# Patient Record
Sex: Female | Born: 1950 | Race: Black or African American | Hispanic: No | Smoking: Never smoker
Health system: Southern US, Community
[De-identification: ages and names within clinical notes are randomized; demographics above are authoritative.]

## PROBLEM LIST (undated history)

## (undated) DIAGNOSIS — M1990 Unspecified osteoarthritis, unspecified site: Secondary | ICD-10-CM

## (undated) DIAGNOSIS — Z9889 Other specified postprocedural states: Secondary | ICD-10-CM

## (undated) DIAGNOSIS — I10 Essential (primary) hypertension: Secondary | ICD-10-CM

## (undated) DIAGNOSIS — R112 Nausea with vomiting, unspecified: Secondary | ICD-10-CM

## (undated) DIAGNOSIS — C801 Malignant (primary) neoplasm, unspecified: Secondary | ICD-10-CM

## (undated) DIAGNOSIS — E669 Obesity, unspecified: Secondary | ICD-10-CM

## (undated) DIAGNOSIS — H409 Unspecified glaucoma: Secondary | ICD-10-CM

## (undated) DIAGNOSIS — Z98891 History of uterine scar from previous surgery: Secondary | ICD-10-CM

## (undated) HISTORY — PX: CARPAL TUNNEL RELEASE: SHX101

## (undated) HISTORY — PX: ROTATOR CUFF REPAIR: SHX139

## (undated) HISTORY — DX: Obesity, unspecified: E66.9

## (undated) HISTORY — PX: OTHER SURGICAL HISTORY: SHX169

---

## 1999-04-19 ENCOUNTER — Encounter: Admission: RE | Admit: 1999-04-19 | Discharge: 1999-04-19 | Payer: Self-pay | Admitting: Orthopedic Surgery

## 1999-04-19 ENCOUNTER — Encounter: Payer: Self-pay | Admitting: Orthopedic Surgery

## 2000-02-24 ENCOUNTER — Ambulatory Visit (HOSPITAL_BASED_OUTPATIENT_CLINIC_OR_DEPARTMENT_OTHER): Admission: RE | Admit: 2000-02-24 | Discharge: 2000-02-24 | Payer: Self-pay | Admitting: Orthopedic Surgery

## 2000-11-22 ENCOUNTER — Encounter (HOSPITAL_COMMUNITY): Admission: RE | Admit: 2000-11-22 | Discharge: 2000-12-22 | Payer: Self-pay | Admitting: Orthopedic Surgery

## 2001-01-04 ENCOUNTER — Encounter: Payer: Self-pay | Admitting: Preventative Medicine

## 2001-01-04 ENCOUNTER — Ambulatory Visit (HOSPITAL_COMMUNITY): Admission: RE | Admit: 2001-01-04 | Discharge: 2001-01-04 | Payer: Self-pay | Admitting: Preventative Medicine

## 2001-02-01 ENCOUNTER — Emergency Department (HOSPITAL_COMMUNITY): Admission: EM | Admit: 2001-02-01 | Discharge: 2001-02-01 | Payer: Self-pay | Admitting: Emergency Medicine

## 2001-02-01 ENCOUNTER — Encounter: Payer: Self-pay | Admitting: Emergency Medicine

## 2002-05-29 ENCOUNTER — Encounter (HOSPITAL_COMMUNITY): Admission: RE | Admit: 2002-05-29 | Discharge: 2002-06-28 | Payer: Self-pay | Admitting: Preventative Medicine

## 2003-12-28 ENCOUNTER — Emergency Department (HOSPITAL_COMMUNITY): Admission: EM | Admit: 2003-12-28 | Discharge: 2003-12-28 | Payer: Self-pay | Admitting: *Deleted

## 2004-04-05 ENCOUNTER — Ambulatory Visit (HOSPITAL_COMMUNITY): Admission: RE | Admit: 2004-04-05 | Discharge: 2004-04-05 | Payer: Self-pay | Admitting: Neurology

## 2009-06-17 ENCOUNTER — Encounter: Payer: Self-pay | Admitting: Internal Medicine

## 2009-06-24 ENCOUNTER — Telehealth (INDEPENDENT_AMBULATORY_CARE_PROVIDER_SITE_OTHER): Payer: Self-pay

## 2009-07-07 ENCOUNTER — Telehealth (INDEPENDENT_AMBULATORY_CARE_PROVIDER_SITE_OTHER): Payer: Self-pay

## 2009-07-08 ENCOUNTER — Ambulatory Visit: Payer: Self-pay | Admitting: Internal Medicine

## 2009-07-08 ENCOUNTER — Ambulatory Visit (HOSPITAL_COMMUNITY): Admission: RE | Admit: 2009-07-08 | Discharge: 2009-07-08 | Payer: Self-pay | Admitting: Internal Medicine

## 2009-07-11 ENCOUNTER — Encounter: Payer: Self-pay | Admitting: Internal Medicine

## 2009-07-28 ENCOUNTER — Encounter: Payer: Self-pay | Admitting: Internal Medicine

## 2010-02-01 NOTE — Letter (Signed)
Summary: Internal Other /triage/instructions  Internal Other /triage/instructions   Imported By: Cloria Spring LPN 81/19/1478 29:56:21  _____________________________________________________________________  External Attachment:    Type:   Image     Comment:   External Document

## 2010-02-01 NOTE — Letter (Signed)
Summary: Patient Notice, Colon Biopsy Results  [facility]  [address]   [place], [place] [address]   Phone: [phone]  Fax: [phone]       July 11, 2009   Amy Newman [address] [place], [place]  [address] 14-Jun-1950    Dear [name],  I am pleased to inform you that the biopsies taken during your recent colonoscopy did not show any evidence of cancer upon pathologic examination.  Additional information/recommendations:  No further action is needed at this time.  Please follow-up with your primary care physician for your other healthcare needs.  You should have a repeat colonoscopy examination  in 7 years.  Please call us if you are having persistent problems or have questions about your condition that have not been fully answered at this time.  Sincerely,    R. Roetta Sessions MD, FACP [facility] Ph: [phone]    Fax: [phone]   Appended Document: Patient Notice, Colon Biopsy Results Letter mailed to pt.  Appended Document: Patient Notice, Colon Biopsy Results remider appt made- cdg

## 2010-02-01 NOTE — Progress Notes (Signed)
Summary: tcs prep  Phone Note Call from Patient Call back at Home Phone [phone]   Caller: Patient Summary of Call: *late entry* pt called yesterday- stated she forgot about her procedure  and that she was to be on a clear liquid diet on Monday afternoon. pt had steak, chicken and pork chops around 7pm at a cookout for the 4th. Rescheduled pt for Thursday July 7th at 10:15 because she is off work this week and this is the only time she can do her procedure. pt aware of new date and time and that she will need a ride to and from the hospital. Encouraged pt to follow instructions and clear liquid diet. Went over prep with pt via telephone. Initial call taken by: Hendricks Limes LPN,  July 08, 979 8:59 AM     Appended Document: tcs prep noted

## 2010-02-01 NOTE — Progress Notes (Signed)
Summary: reviewed meds for tcs  Phone Note Call from Patient   Caller: Patient Summary of Call: I had left a message for pt to call, she returned my call and i reviewed her meds for the procedure, and all the meds are the same. Initial call taken by: Cloria Spring LPN,  June 24, 2009 3:58 PM

## 2010-02-01 NOTE — Letter (Signed)
Summary: tcs order  tcs order   Imported By: Hendricks Limes LPN 16/10/9602 54:09:81  _____________________________________________________________________  External Attachment:    Type:   Image     Comment:   External Document

## 2010-12-02 ENCOUNTER — Other Ambulatory Visit (HOSPITAL_COMMUNITY): Payer: Self-pay | Admitting: Family Medicine

## 2010-12-02 DIAGNOSIS — Z139 Encounter for screening, unspecified: Secondary | ICD-10-CM

## 2010-12-29 ENCOUNTER — Ambulatory Visit (HOSPITAL_COMMUNITY)
Admission: RE | Admit: 2010-12-29 | Discharge: 2010-12-29 | Disposition: A | Discharge status: Home / Self Care | Payer: Managed Care, Other (non HMO) | Source: Ambulatory Visit | Attending: Family Medicine | Admitting: Family Medicine

## 2010-12-29 DIAGNOSIS — Z139 Encounter for screening, unspecified: Secondary | ICD-10-CM

## 2010-12-29 DIAGNOSIS — Z1231 Encounter for screening mammogram for malignant neoplasm of breast: Secondary | ICD-10-CM | POA: Insufficient documentation

## 2011-10-25 ENCOUNTER — Other Ambulatory Visit (HOSPITAL_COMMUNITY): Payer: Self-pay | Admitting: Family Medicine

## 2011-10-25 DIAGNOSIS — R109 Unspecified abdominal pain: Secondary | ICD-10-CM

## 2011-10-26 ENCOUNTER — Ambulatory Visit (HOSPITAL_COMMUNITY)
Admission: RE | Admit: 2011-10-26 | Discharge: 2011-10-26 | Disposition: A | Discharge status: Home / Self Care | Payer: Managed Care, Other (non HMO) | Source: Ambulatory Visit | Attending: Family Medicine | Admitting: Family Medicine

## 2011-10-26 DIAGNOSIS — R109 Unspecified abdominal pain: Secondary | ICD-10-CM | POA: Insufficient documentation

## 2016-06-06 ENCOUNTER — Encounter: Payer: Self-pay | Admitting: Internal Medicine

## 2017-01-08 ENCOUNTER — Other Ambulatory Visit (HOSPITAL_COMMUNITY): Payer: Self-pay | Admitting: Family Medicine

## 2017-01-08 DIAGNOSIS — Z0001 Encounter for general adult medical examination with abnormal findings: Secondary | ICD-10-CM

## 2017-01-08 DIAGNOSIS — Z363 Encounter for antenatal screening for malformations: Secondary | ICD-10-CM

## 2017-01-08 DIAGNOSIS — Z1389 Encounter for screening for other disorder: Secondary | ICD-10-CM

## 2017-01-15 ENCOUNTER — Ambulatory Visit (HOSPITAL_COMMUNITY)
Admission: RE | Admit: 2017-01-15 | Discharge: 2017-01-15 | Disposition: A | Discharge status: Home / Self Care | Payer: Managed Care, Other (non HMO) | Source: Ambulatory Visit | Attending: Family Medicine | Admitting: Family Medicine

## 2017-01-15 DIAGNOSIS — Z0001 Encounter for general adult medical examination with abnormal findings: Secondary | ICD-10-CM | POA: Diagnosis not present

## 2017-01-15 DIAGNOSIS — Z1389 Encounter for screening for other disorder: Secondary | ICD-10-CM

## 2017-01-15 DIAGNOSIS — R221 Localized swelling, mass and lump, neck: Secondary | ICD-10-CM | POA: Insufficient documentation

## 2017-04-05 ENCOUNTER — Ambulatory Visit (HOSPITAL_COMMUNITY)
Admission: RE | Admit: 2017-04-05 | Discharge: 2017-04-05 | Disposition: A | Discharge status: Home / Self Care | Payer: Managed Care, Other (non HMO) | Source: Ambulatory Visit | Attending: Family Medicine | Admitting: Family Medicine

## 2017-04-05 ENCOUNTER — Other Ambulatory Visit (HOSPITAL_COMMUNITY): Payer: Self-pay | Admitting: Family Medicine

## 2017-04-05 DIAGNOSIS — M545 Low back pain: Secondary | ICD-10-CM | POA: Diagnosis not present

## 2017-04-05 DIAGNOSIS — M4316 Spondylolisthesis, lumbar region: Secondary | ICD-10-CM | POA: Insufficient documentation

## 2018-04-08 DIAGNOSIS — I10 Essential (primary) hypertension: Secondary | ICD-10-CM | POA: Diagnosis not present

## 2018-04-08 DIAGNOSIS — H81393 Other peripheral vertigo, bilateral: Secondary | ICD-10-CM | POA: Diagnosis not present

## 2018-04-08 DIAGNOSIS — G43C1 Periodic headache syndromes in child or adult, intractable: Secondary | ICD-10-CM | POA: Diagnosis not present

## 2018-04-08 DIAGNOSIS — M130 Polyarthritis, unspecified: Secondary | ICD-10-CM | POA: Diagnosis not present

## 2018-04-25 DIAGNOSIS — G4489 Other headache syndrome: Secondary | ICD-10-CM | POA: Diagnosis not present

## 2018-04-25 DIAGNOSIS — Z1389 Encounter for screening for other disorder: Secondary | ICD-10-CM | POA: Diagnosis not present

## 2018-04-25 DIAGNOSIS — E7849 Other hyperlipidemia: Secondary | ICD-10-CM | POA: Diagnosis not present

## 2018-04-25 DIAGNOSIS — Z681 Body mass index (BMI) 19 or less, adult: Secondary | ICD-10-CM | POA: Diagnosis not present

## 2018-04-25 DIAGNOSIS — I10 Essential (primary) hypertension: Secondary | ICD-10-CM | POA: Diagnosis not present

## 2018-07-11 DIAGNOSIS — H40033 Anatomical narrow angle, bilateral: Secondary | ICD-10-CM | POA: Diagnosis not present

## 2018-07-11 DIAGNOSIS — H401131 Primary open-angle glaucoma, bilateral, mild stage: Secondary | ICD-10-CM | POA: Diagnosis not present

## 2018-11-06 DIAGNOSIS — H401131 Primary open-angle glaucoma, bilateral, mild stage: Secondary | ICD-10-CM | POA: Diagnosis not present

## 2018-11-06 DIAGNOSIS — H40033 Anatomical narrow angle, bilateral: Secondary | ICD-10-CM | POA: Diagnosis not present

## 2018-11-08 DIAGNOSIS — E782 Mixed hyperlipidemia: Secondary | ICD-10-CM | POA: Diagnosis not present

## 2018-11-08 DIAGNOSIS — Z6833 Body mass index (BMI) 33.0-33.9, adult: Secondary | ICD-10-CM | POA: Diagnosis not present

## 2018-11-08 DIAGNOSIS — M170 Bilateral primary osteoarthritis of knee: Secondary | ICD-10-CM | POA: Diagnosis not present

## 2018-11-08 DIAGNOSIS — E6609 Other obesity due to excess calories: Secondary | ICD-10-CM | POA: Diagnosis not present

## 2018-11-08 DIAGNOSIS — E7849 Other hyperlipidemia: Secondary | ICD-10-CM | POA: Diagnosis not present

## 2018-11-08 DIAGNOSIS — I10 Essential (primary) hypertension: Secondary | ICD-10-CM | POA: Diagnosis not present

## 2018-11-08 DIAGNOSIS — M25512 Pain in left shoulder: Secondary | ICD-10-CM | POA: Diagnosis not present

## 2018-11-11 ENCOUNTER — Other Ambulatory Visit (HOSPITAL_COMMUNITY): Payer: Self-pay | Admitting: Family Medicine

## 2018-11-11 DIAGNOSIS — Z1231 Encounter for screening mammogram for malignant neoplasm of breast: Secondary | ICD-10-CM

## 2018-11-12 DIAGNOSIS — Z1211 Encounter for screening for malignant neoplasm of colon: Secondary | ICD-10-CM | POA: Diagnosis not present

## 2018-11-26 DIAGNOSIS — R69 Illness, unspecified: Secondary | ICD-10-CM | POA: Diagnosis not present

## 2018-12-17 DIAGNOSIS — R69 Illness, unspecified: Secondary | ICD-10-CM | POA: Diagnosis not present

## 2019-01-08 DIAGNOSIS — H81393 Other peripheral vertigo, bilateral: Secondary | ICD-10-CM | POA: Diagnosis not present

## 2019-01-08 DIAGNOSIS — I10 Essential (primary) hypertension: Secondary | ICD-10-CM | POA: Diagnosis not present

## 2019-01-08 DIAGNOSIS — M130 Polyarthritis, unspecified: Secondary | ICD-10-CM | POA: Diagnosis not present

## 2019-01-08 DIAGNOSIS — G43C1 Periodic headache syndromes in child or adult, intractable: Secondary | ICD-10-CM | POA: Diagnosis not present

## 2019-01-16 ENCOUNTER — Other Ambulatory Visit (HOSPITAL_COMMUNITY): Payer: Self-pay | Admitting: Family Medicine

## 2019-01-16 DIAGNOSIS — G4489 Other headache syndrome: Secondary | ICD-10-CM | POA: Diagnosis not present

## 2019-01-16 DIAGNOSIS — E2839 Other primary ovarian failure: Secondary | ICD-10-CM

## 2019-01-16 DIAGNOSIS — H8110 Benign paroxysmal vertigo, unspecified ear: Secondary | ICD-10-CM | POA: Diagnosis not present

## 2019-01-16 DIAGNOSIS — R700 Elevated erythrocyte sedimentation rate: Secondary | ICD-10-CM | POA: Diagnosis not present

## 2019-01-16 DIAGNOSIS — Z6833 Body mass index (BMI) 33.0-33.9, adult: Secondary | ICD-10-CM | POA: Diagnosis not present

## 2019-01-16 DIAGNOSIS — R42 Dizziness and giddiness: Secondary | ICD-10-CM | POA: Diagnosis not present

## 2019-01-16 DIAGNOSIS — R9431 Abnormal electrocardiogram [ECG] [EKG]: Secondary | ICD-10-CM | POA: Diagnosis not present

## 2019-01-16 DIAGNOSIS — E6609 Other obesity due to excess calories: Secondary | ICD-10-CM | POA: Diagnosis not present

## 2019-01-16 DIAGNOSIS — Z1389 Encounter for screening for other disorder: Secondary | ICD-10-CM | POA: Diagnosis not present

## 2019-01-29 DIAGNOSIS — H903 Sensorineural hearing loss, bilateral: Secondary | ICD-10-CM | POA: Diagnosis not present

## 2019-01-29 DIAGNOSIS — R42 Dizziness and giddiness: Secondary | ICD-10-CM | POA: Diagnosis not present

## 2019-02-04 DIAGNOSIS — R42 Dizziness and giddiness: Secondary | ICD-10-CM | POA: Diagnosis not present

## 2019-02-13 DIAGNOSIS — Z681 Body mass index (BMI) 19 or less, adult: Secondary | ICD-10-CM | POA: Diagnosis not present

## 2019-02-13 DIAGNOSIS — E7849 Other hyperlipidemia: Secondary | ICD-10-CM | POA: Diagnosis not present

## 2019-02-13 DIAGNOSIS — Z1389 Encounter for screening for other disorder: Secondary | ICD-10-CM | POA: Diagnosis not present

## 2019-02-13 DIAGNOSIS — Z0001 Encounter for general adult medical examination with abnormal findings: Secondary | ICD-10-CM | POA: Diagnosis not present

## 2019-02-13 DIAGNOSIS — M170 Bilateral primary osteoarthritis of knee: Secondary | ICD-10-CM | POA: Diagnosis not present

## 2019-02-13 DIAGNOSIS — I10 Essential (primary) hypertension: Secondary | ICD-10-CM | POA: Diagnosis not present

## 2019-02-17 ENCOUNTER — Ambulatory Visit (HOSPITAL_COMMUNITY): Payer: Managed Care, Other (non HMO)

## 2019-02-17 ENCOUNTER — Other Ambulatory Visit (HOSPITAL_COMMUNITY): Payer: Managed Care, Other (non HMO)

## 2019-02-18 DIAGNOSIS — R69 Illness, unspecified: Secondary | ICD-10-CM | POA: Diagnosis not present

## 2019-02-19 DIAGNOSIS — H832X2 Labyrinthine dysfunction, left ear: Secondary | ICD-10-CM | POA: Diagnosis not present

## 2019-02-19 DIAGNOSIS — R42 Dizziness and giddiness: Secondary | ICD-10-CM | POA: Diagnosis not present

## 2019-02-24 DIAGNOSIS — H832X2 Labyrinthine dysfunction, left ear: Secondary | ICD-10-CM | POA: Diagnosis not present

## 2019-02-24 DIAGNOSIS — R42 Dizziness and giddiness: Secondary | ICD-10-CM | POA: Diagnosis not present

## 2019-02-26 ENCOUNTER — Ambulatory Visit (HOSPITAL_COMMUNITY)
Admission: RE | Admit: 2019-02-26 | Discharge: 2019-02-26 | Disposition: A | Discharge status: Home / Self Care | Payer: Medicare HMO | Source: Ambulatory Visit | Attending: Family Medicine | Admitting: Family Medicine

## 2019-02-26 ENCOUNTER — Other Ambulatory Visit: Payer: Self-pay

## 2019-02-26 DIAGNOSIS — E2839 Other primary ovarian failure: Secondary | ICD-10-CM | POA: Diagnosis not present

## 2019-02-26 DIAGNOSIS — Z1231 Encounter for screening mammogram for malignant neoplasm of breast: Secondary | ICD-10-CM

## 2019-02-26 DIAGNOSIS — Z13820 Encounter for screening for osteoporosis: Secondary | ICD-10-CM | POA: Diagnosis not present

## 2019-02-26 DIAGNOSIS — Z780 Asymptomatic menopausal state: Secondary | ICD-10-CM | POA: Diagnosis not present

## 2019-02-27 DIAGNOSIS — H832X2 Labyrinthine dysfunction, left ear: Secondary | ICD-10-CM | POA: Diagnosis not present

## 2019-02-27 DIAGNOSIS — R42 Dizziness and giddiness: Secondary | ICD-10-CM | POA: Diagnosis not present

## 2019-03-27 DIAGNOSIS — R197 Diarrhea, unspecified: Secondary | ICD-10-CM | POA: Diagnosis not present

## 2019-03-27 DIAGNOSIS — R52 Pain, unspecified: Secondary | ICD-10-CM | POA: Diagnosis not present

## 2019-05-06 DIAGNOSIS — H40033 Anatomical narrow angle, bilateral: Secondary | ICD-10-CM | POA: Diagnosis not present

## 2019-05-06 DIAGNOSIS — H2513 Age-related nuclear cataract, bilateral: Secondary | ICD-10-CM | POA: Diagnosis not present

## 2019-05-06 DIAGNOSIS — H401131 Primary open-angle glaucoma, bilateral, mild stage: Secondary | ICD-10-CM | POA: Diagnosis not present

## 2019-05-06 DIAGNOSIS — H25013 Cortical age-related cataract, bilateral: Secondary | ICD-10-CM | POA: Diagnosis not present

## 2019-05-27 DIAGNOSIS — M25562 Pain in left knee: Secondary | ICD-10-CM | POA: Diagnosis not present

## 2019-05-27 DIAGNOSIS — M2550 Pain in unspecified joint: Secondary | ICD-10-CM | POA: Diagnosis not present

## 2019-05-27 DIAGNOSIS — E669 Obesity, unspecified: Secondary | ICD-10-CM | POA: Diagnosis not present

## 2019-05-27 DIAGNOSIS — Z6834 Body mass index (BMI) 34.0-34.9, adult: Secondary | ICD-10-CM | POA: Diagnosis not present

## 2019-05-27 DIAGNOSIS — R7989 Other specified abnormal findings of blood chemistry: Secondary | ICD-10-CM | POA: Diagnosis not present

## 2019-05-27 DIAGNOSIS — M25561 Pain in right knee: Secondary | ICD-10-CM | POA: Diagnosis not present

## 2019-06-10 DIAGNOSIS — R69 Illness, unspecified: Secondary | ICD-10-CM | POA: Diagnosis not present

## 2019-08-27 DIAGNOSIS — I10 Essential (primary) hypertension: Secondary | ICD-10-CM | POA: Diagnosis not present

## 2019-08-27 DIAGNOSIS — Z809 Family history of malignant neoplasm, unspecified: Secondary | ICD-10-CM | POA: Diagnosis not present

## 2019-08-27 DIAGNOSIS — H409 Unspecified glaucoma: Secondary | ICD-10-CM | POA: Diagnosis not present

## 2019-08-27 DIAGNOSIS — K219 Gastro-esophageal reflux disease without esophagitis: Secondary | ICD-10-CM | POA: Diagnosis not present

## 2019-08-27 DIAGNOSIS — E785 Hyperlipidemia, unspecified: Secondary | ICD-10-CM | POA: Diagnosis not present

## 2019-08-27 DIAGNOSIS — Z833 Family history of diabetes mellitus: Secondary | ICD-10-CM | POA: Diagnosis not present

## 2019-08-27 DIAGNOSIS — G8929 Other chronic pain: Secondary | ICD-10-CM | POA: Diagnosis not present

## 2019-08-28 DIAGNOSIS — Z6834 Body mass index (BMI) 34.0-34.9, adult: Secondary | ICD-10-CM | POA: Diagnosis not present

## 2019-08-28 DIAGNOSIS — E6609 Other obesity due to excess calories: Secondary | ICD-10-CM | POA: Diagnosis not present

## 2019-08-28 DIAGNOSIS — M79671 Pain in right foot: Secondary | ICD-10-CM | POA: Diagnosis not present

## 2019-08-28 DIAGNOSIS — I10 Essential (primary) hypertension: Secondary | ICD-10-CM | POA: Diagnosis not present

## 2019-09-02 DIAGNOSIS — K219 Gastro-esophageal reflux disease without esophagitis: Secondary | ICD-10-CM | POA: Diagnosis not present

## 2019-09-02 DIAGNOSIS — I10 Essential (primary) hypertension: Secondary | ICD-10-CM | POA: Diagnosis not present

## 2019-09-02 DIAGNOSIS — M792 Neuralgia and neuritis, unspecified: Secondary | ICD-10-CM | POA: Diagnosis not present

## 2019-09-02 DIAGNOSIS — E7849 Other hyperlipidemia: Secondary | ICD-10-CM | POA: Diagnosis not present

## 2019-09-02 DIAGNOSIS — M79671 Pain in right foot: Secondary | ICD-10-CM | POA: Diagnosis not present

## 2019-09-16 ENCOUNTER — Other Ambulatory Visit: Payer: Self-pay

## 2019-09-16 ENCOUNTER — Ambulatory Visit
Admission: EM | Admit: 2019-09-16 | Discharge: 2019-09-16 | Disposition: A | Discharge status: Home / Self Care | Payer: Medicare HMO

## 2019-09-16 DIAGNOSIS — G629 Polyneuropathy, unspecified: Secondary | ICD-10-CM

## 2019-09-16 DIAGNOSIS — G5712 Meralgia paresthetica, left lower limb: Secondary | ICD-10-CM

## 2019-09-16 HISTORY — DX: Essential (primary) hypertension: I10

## 2019-09-16 HISTORY — DX: History of uterine scar from previous surgery: Z98.891

## 2019-09-16 MED ORDER — GABAPENTIN 300 MG PO CAPS: 300.0000 mg | ORAL_CAPSULE | Freq: Two times a day (BID) | ORAL | 0 refills | Status: DC

## 2019-09-16 MED ORDER — LIDOCAINE 5 % EX PTCH: 1.0000 | MEDICATED_PATCH | CUTANEOUS | 0 refills | Status: DC

## 2019-09-16 NOTE — ED Triage Notes (Signed)
Pt presents with left leg pain and some tingling in left thigh . Pt was taking prednisone for right toe but no relief in leg

## 2019-09-16 NOTE — Discharge Instructions (Addendum)
Please return to urgent care if you observe a rash or symptoms worsen.

## 2019-09-24 ENCOUNTER — Other Ambulatory Visit: Payer: Self-pay

## 2019-09-24 ENCOUNTER — Encounter: Payer: Self-pay | Admitting: Emergency Medicine

## 2019-09-24 ENCOUNTER — Ambulatory Visit
Admission: EM | Admit: 2019-09-24 | Discharge: 2019-09-24 | Disposition: A | Discharge status: Home / Self Care | Payer: Medicare HMO | Attending: Emergency Medicine | Admitting: Emergency Medicine

## 2019-09-24 DIAGNOSIS — B0229 Other postherpetic nervous system involvement: Secondary | ICD-10-CM

## 2019-09-24 MED ORDER — VALACYCLOVIR HCL 1 G PO TABS: 1000.0000 mg | ORAL_TABLET | Freq: Three times a day (TID) | ORAL | 0 refills | Status: AC

## 2019-09-24 NOTE — ED Provider Notes (Signed)
[place]   [id number] 09/24/19 Arrival Time: 3  CC: Shingles follow up  SUBJECTIVE:  Amy Newman is a 69 y.o. female who presents with a numbness and rash to outside of LT leg x 1 week.  Was seen here on 9/14 for shingles.  Treated with gabapentin without relief.  Describes as numb and throbbing.  Rash noticeable after taking a shower.  Reports similar symptoms in the past that improved with antiviral.   Denies fever, chills, nausea, vomiting, erythema, swelling, discharge.  ROS: As per HPI.  All other pertinent ROS negative.     Past Medical History:  Diagnosis Date  . H/O: cesarean section   . Hypertension    Past Surgical History:  Procedure Laterality Date  . CARPAL TUNNEL RELEASE Right    Allergies  Allergen Reactions  . Penicillins Rash   No current facility-administered medications on file prior to encounter.   Current Outpatient Medications on File Prior to Encounter  Medication Sig Dispense Refill  . atenolol (TENORMIN) 50 MG tablet Take 50 mg by mouth daily.    Marland Kitchen atorvastatin (LIPITOR) 20 MG tablet Take 20 mg by mouth daily.    Marland Kitchen gabapentin (NEURONTIN) 300 MG capsule Take 1 capsule (300 mg total) by mouth 2 (two) times daily. 60 capsule 0  . lidocaine (LIDODERM) 5 % Place 1 patch onto the skin daily. Remove & Discard patch within 12 hours or as directed by MD 30 patch 0  . methylPREDNISolone (MEDROL DOSEPAK) 4 MG TBPK tablet Take by mouth as directed.    Marland Kitchen omeprazole (PRILOSEC) 20 MG capsule Take 20 mg by mouth daily.     Social History   Socioeconomic History  . Marital status: Divorced    Spouse name: Not on file  . Number of children: Not on file  . Years of education: Not on file  . Highest education level: Not on file  Occupational History  . Not on file  Tobacco Use  . Smoking status: Never Smoker  . Smokeless tobacco: Never Used  Substance and Sexual Activity  . Alcohol use: Not on file  . Drug use: Not on file  . Sexual  activity: Not on file  Other Topics Concern  . Not on file  Social History Narrative  . Not on file   Social Determinants of Health   Financial Resource Strain:   . Difficulty of Paying Living Expenses: Not on file  Food Insecurity:   . Worried About Charity fundraiser in the Last Year: Not on file  . Ran Out of Food in the Last Year: Not on file  Transportation Needs:   . Lack of Transportation (Medical): Not on file  . Lack of Transportation (Non-Medical): Not on file  Physical Activity:   . Days of Exercise per Week: Not on file  . Minutes of Exercise per Session: Not on file  Stress:   . Feeling of Stress : Not on file  Social Connections:   . Frequency of Communication with Friends and Family: Not on file  . Frequency of Social Gatherings with Friends and Family: Not on file  . Attends Religious Services: Not on file  . Active Member of Clubs or Organizations: Not on file  . Attends Archivist Meetings: Not on file  . Marital Status: Not on file  Intimate Partner Violence:   . Fear of Current or Ex-Partner: Not on file  . Emotionally Abused: Not on file  . Physically Abused: Not  on file  . Sexually Abused: Not on file   Family History  Problem Relation Age of Onset  . Stroke Mother     OBJECTIVE: Vitals:   09/24/19 1526 09/24/19 1527  BP: (!) 172/84   Pulse: 75   Resp: 19   Temp: 98.1 F (36.7 C)   TempSrc: Oral   SpO2: 96%   Weight:  220 lb (99.8 kg)  Height:  5\' 7"  (1.702 m)    General appearance: alert; no distress Head: NCAT Lungs: normal respiratory effort Extremities: no edema Skin: warm and dry Psychological: alert and cooperative; normal mood and affect  ASSESSMENT & PLAN:  1. Post herpetic neuralgia     Meds ordered this encounter  Medications  . valACYclovir (VALTREX) 1000 MG tablet    Sig: Take 1 tablet (1,000 mg total) by mouth 3 (three) times daily for 10 days.    Dispense:  30 tablet    Refill:  0    Order Specific  Question:   Supervising Provider    Answer:   Raylene Everts [[id number]]   Rest and use ice/heat as needed for symptomatic relief Prescribed valacyclovir 1000mg  3x/day for 10 days Continue with gabapentin as prescribed Use OTC medications such as ibuprofen/ tylenol.   Follow up with PCP next week Return here or go to ER if you have any new or worsening symptoms (such as eye involvement, severe pain, or signs of secondary infection such as fever, chills, nausea, vomiting, discharge, redness or warmth over site of rash)  Reviewed expectations re: course of current medical issues. Questions answered. Outlined signs and symptoms indicating need for more acute intervention. Patient verbalized understanding. After Visit Summary given.   Lestine Box, PA-C 09/24/19 1540

## 2019-09-24 NOTE — Discharge Instructions (Signed)
Rest and use ice/heat as needed for symptomatic relief Prescribed valacyclovir 1000mg  3x/day for 10 days Continue with gabapentin as prescribed Use OTC medications such as ibuprofen/ tylenol.   Follow up with PCP next week Return here or go to ER if you have any new or worsening symptoms (such as eye involvement, severe pain, or signs of secondary infection such as fever, chills, nausea, vomiting, discharge, redness or warmth over site of rash)

## 2019-09-24 NOTE — ED Triage Notes (Signed)
LT leg pain from shingles, states the medicine she was given is not helping at all.

## 2019-09-30 DIAGNOSIS — M79671 Pain in right foot: Secondary | ICD-10-CM | POA: Diagnosis not present

## 2019-09-30 DIAGNOSIS — M792 Neuralgia and neuritis, unspecified: Secondary | ICD-10-CM | POA: Diagnosis not present

## 2019-10-16 DIAGNOSIS — Z6834 Body mass index (BMI) 34.0-34.9, adult: Secondary | ICD-10-CM | POA: Diagnosis not present

## 2019-10-16 DIAGNOSIS — E7849 Other hyperlipidemia: Secondary | ICD-10-CM | POA: Diagnosis not present

## 2019-10-16 DIAGNOSIS — B029 Zoster without complications: Secondary | ICD-10-CM | POA: Diagnosis not present

## 2019-10-16 DIAGNOSIS — E782 Mixed hyperlipidemia: Secondary | ICD-10-CM | POA: Diagnosis not present

## 2019-10-16 DIAGNOSIS — E6609 Other obesity due to excess calories: Secondary | ICD-10-CM | POA: Diagnosis not present

## 2019-10-21 DIAGNOSIS — M79671 Pain in right foot: Secondary | ICD-10-CM | POA: Diagnosis not present

## 2019-10-21 DIAGNOSIS — M792 Neuralgia and neuritis, unspecified: Secondary | ICD-10-CM | POA: Diagnosis not present

## 2019-10-28 DIAGNOSIS — M79671 Pain in right foot: Secondary | ICD-10-CM | POA: Diagnosis not present

## 2019-10-28 DIAGNOSIS — M792 Neuralgia and neuritis, unspecified: Secondary | ICD-10-CM | POA: Diagnosis not present

## 2019-11-01 DIAGNOSIS — E7849 Other hyperlipidemia: Secondary | ICD-10-CM | POA: Diagnosis not present

## 2019-11-01 DIAGNOSIS — K219 Gastro-esophageal reflux disease without esophagitis: Secondary | ICD-10-CM | POA: Diagnosis not present

## 2019-11-01 DIAGNOSIS — I10 Essential (primary) hypertension: Secondary | ICD-10-CM | POA: Diagnosis not present

## 2019-11-06 DIAGNOSIS — H40033 Anatomical narrow angle, bilateral: Secondary | ICD-10-CM | POA: Diagnosis not present

## 2019-11-06 DIAGNOSIS — H401131 Primary open-angle glaucoma, bilateral, mild stage: Secondary | ICD-10-CM | POA: Diagnosis not present

## 2019-11-12 ENCOUNTER — Encounter: Payer: Self-pay | Admitting: Neurology

## 2019-11-12 ENCOUNTER — Ambulatory Visit: Payer: Medicare HMO | Admitting: Neurology

## 2019-11-12 ENCOUNTER — Other Ambulatory Visit: Payer: Self-pay

## 2019-11-12 DIAGNOSIS — M79671 Pain in right foot: Secondary | ICD-10-CM | POA: Diagnosis not present

## 2019-11-12 MED ORDER — GABAPENTIN 400 MG PO CAPS: 400.0000 mg | ORAL_CAPSULE | Freq: Three times a day (TID) | ORAL | 3 refills | Status: DC

## 2019-11-12 MED ORDER — CARBAMAZEPINE 200 MG PO TABS: ORAL_TABLET | ORAL | 3 refills | Status: DC

## 2019-11-12 NOTE — Patient Instructions (Signed)
We will start carbamazepine for the foot pain.  Tegretol (carbamazepine) may result in dizziness, gait instability, cognitive slowing, or drowsiness. Sometimes, and allergic rash may occur, or a photosensitive rash may occur. If any significant side effects are noted, please contact our office.

## 2019-11-12 NOTE — Progress Notes (Signed)
Reason for visit: Right foot pain  Referring physician: Dr. Nani Ravens is a 69 y.o. female  History of present illness:  Ms. Moya is a 69 year old right-handed black female with a history of right foot pain that began about 5 weeks prior to this evaluation. The patient noted the spontaneous onset of the problem. She has had pain in the medial aspect of the side of the foot from the base of the big toe to about halfway back on the foot. The patient describes sharp shooting almost electric pains that come and go, she has very little pain between the brief jabs of pain. She has gotten some injections through her podiatrist which have not helped the pain that much but she is now somewhat sore on the top of the foot. The patient notes that at times light touch or wearing a shoe will bring on the pain, but she does have pain throughout the night which keeps her awake. The patient claims that she put on a patch of some sort that did not help, possibly a lidocaine patch. She has been on gabapentin, currently taking 400 mg 3 times daily without benefit. She denies any weakness in the leg, she denies back pain or pain down the leg. She has not any changes in balance or any troubles controlling bowels or the bladder. She did have an episode of shingles affecting the left thigh and the midportion of September 2021, she claims that this issue has resolved. She denies any shingles rash involving the right leg. She is sent to this office for further evaluation.  Past Medical History:  Diagnosis Date  . H/O: cesarean section   . Hypertension   . Obese     Past Surgical History:  Procedure Laterality Date  . athroscopic knee surgery bilaterall Bilateral   . CARPAL TUNNEL RELEASE Right   . ROTATOR CUFF REPAIR Right     Family History  Problem Relation Age of Onset  . Stroke Mother   . Diabetes Mother   . Cancer Father   . Cancer Sister        breast  . Diabetes Brother   .  Peripheral Artery Disease Brother     Social history:  reports that she has never smoked. She has never used smokeless tobacco. She reports that she does not drink alcohol and does not use drugs.  Medications:  Prior to Admission medications   Medication Sig Start Date End Date Taking? Authorizing Provider  atenolol (TENORMIN) 50 MG tablet Take 50 mg by mouth daily. 08/05/19   [provider]  atorvastatin (LIPITOR) 20 MG tablet Take 20 mg by mouth daily. 08/02/19   [provider]  gabapentin (NEURONTIN) 300 MG capsule Take 1 capsule (300 mg total) by mouth 2 (two) times daily. 09/16/19 10/16/19  LampteyMyrene Galas, MD  lidocaine (LIDODERM) 5 % Place 1 patch onto the skin daily. Remove & Discard patch within 12 hours or as directed by MD 09/16/19   Lamptey, Myrene Galas, MD  methylPREDNISolone (MEDROL DOSEPAK) 4 MG TBPK tablet Take by mouth as directed. 09/13/19   [provider]  omeprazole (PRILOSEC) 20 MG capsule Take 20 mg by mouth daily. 07/12/19   [provider]      Allergies  Allergen Reactions  . Penicillins Rash    ROS:  Out of a complete 14 system review of symptoms, the patient complains only of the following symptoms, and all other reviewed systems are negative.  Right foot pain Knee pain, arthritis pain  Blood pressure 138/74, pulse 76, height 5\' 8"  (1.727 m), weight 229 lb (103.9 kg).  Physical Exam  General: The patient is alert and cooperative at the time of the examination. The patient is moderately obese.  Eyes: Pupils are equal, round, and reactive to light. Discs are flat bilaterally.  Neck: The neck is supple, no carotid bruits are noted.  Respiratory: The respiratory examination is clear.  Cardiovascular: The cardiovascular examination reveals a regular rate and rhythm, no obvious murmurs or rubs are noted.  Skin: Extremities are without significant edema.  Neurologic Exam  Mental status: The patient is alert and oriented  x 3 at the time of the examination. The patient has apparent normal recent and remote memory, with an apparently normal attention span and concentration ability.  Cranial nerves: Facial symmetry is present. There is good sensation of the face to pinprick and soft touch bilaterally. The strength of the facial muscles and the muscles to head turning and shoulder shrug are normal bilaterally. Speech is well enunciated, no aphasia or dysarthria is noted. Extraocular movements are full. Visual fields are full. The tongue is midline, and the patient has symmetric elevation of the soft palate. No obvious hearing deficits are noted.  Motor: The motor testing reveals 5 over 5 strength of all 4 extremities. Good symmetric motor tone is noted throughout.  Sensory: Sensory testing is intact to pinprick, soft touch, vibration sensation, and position sense on all 4 extremities, with exception there is some decreased pinprick sensation on the dorsum of the right foot as compared to the left. No evidence of extinction is noted.  Coordination: Cerebellar testing reveals good finger-nose-finger and heel-to-shin bilaterally.  Gait and station: Gait is notable for some bowing of the legs bilaterally, gait is somewhat wide-based. Patient has difficulty with tandem gait, somewhat unsteady. Romberg is negative. The patient is not able to walk on heels or the toes bilaterally.  Reflexes: Deep tendon reflexes are symmetric, but somewhat depressed bilaterally. Toes are downgoing bilaterally.   Assessment/Plan:  1. Right foot pain  The patient has a neuralgia quality to the pain of the right foot. She has some decreased sensation to the dorsum of the foot, she could potentially have some involvement distally of the peroneal nerve that has resulted in some neuralgia type pain. The patient be set up for nerve conduction studies of the right leg and EMG on the right leg. She will placed on carbamazepine and continue the  gabapentin for now. She will follow-up for the EMG evaluation and follow-up in the office in about 4 months.  Jill Alexanders MD 11/12/2019 10:27 AM  [facility] [address] [place],  [address]  Phone [phone] Fax [phone]

## 2019-11-26 ENCOUNTER — Ambulatory Visit (INDEPENDENT_AMBULATORY_CARE_PROVIDER_SITE_OTHER): Payer: Medicare HMO | Admitting: Neurology

## 2019-11-26 ENCOUNTER — Ambulatory Visit: Payer: Medicare HMO | Admitting: Neurology

## 2019-11-26 ENCOUNTER — Encounter: Payer: Self-pay | Admitting: Neurology

## 2019-11-26 ENCOUNTER — Other Ambulatory Visit: Payer: Self-pay

## 2019-11-26 DIAGNOSIS — M79671 Pain in right foot: Secondary | ICD-10-CM

## 2019-11-26 DIAGNOSIS — M5417 Radiculopathy, lumbosacral region: Secondary | ICD-10-CM

## 2019-11-26 NOTE — Procedures (Signed)
     HISTORY:  Amy Newman is a 69 year old patient with a history of onset of right foot and big toe discomfort and pain. The pain can be quite severe at times. She denies sciatica type pain down the leg but she is being evaluated for possible neuropathy or a radiculopathy.  NERVE CONDUCTION STUDIES:  Nerve conduction studies were performed on right lower extremity. The distal motor latencies and motor amplitudes for the peroneal and posterior tibial nerves were within normal limits. The nerve conduction velocities for these nerves were also normal. The sensory latencies for the peroneal and sural nerves were within normal limits. The F wave latency for the posterior tibial nerve was within normal limits.   EMG STUDIES:  EMG study was performed on the right lower extremity:  The tibialis anterior muscle reveals 2 to 4K motor units with full recruitment. No fibrillations or positive waves were seen. The peroneus tertius muscle reveals 2 to 4K motor units with full recruitment. No fibrillations or positive waves were seen. The medial gastrocnemius muscle reveals 1 to 3K motor units with full recruitment. 1+ positive waves were seen. The vastus lateralis muscle reveals 2 to 4K motor units with full recruitment. No fibrillations or positive waves were seen. The iliopsoas muscle reveals 2 to 4K motor units with full recruitment. No fibrillations or positive waves were seen. The biceps femoris muscle (long head) reveals 2 to 4K motor units with full recruitment. No fibrillations or positive waves were seen. The lumbosacral paraspinal muscles were tested at 3 levels, and revealed no abnormalities of insertional activity at the upper levels tested. 1+ positive waves were seen at the middle level, 2+ positive waves in the lower level. There was good relaxation.   IMPRESSION:  Nerve conduction studies done on the right lower extremity were unremarkable, no evidence of a neuropathy was seen.  EMG of the right lower extremity shows findings consistent with an acute S1 radiculopathy.  Jill Alexanders MD 11/26/2019 3:35 PM  [facility] [address] [place], [place] [address]  Phone [phone] Fax [phone]

## 2019-11-26 NOTE — Progress Notes (Addendum)
The patient comes into the office today for EMG nerve conduction study. Nerve conduction studies on the right leg were unremarkable, EMG reveals evidence of what looks like an acute S1 radiculopathy.  The patient will be set up for MRI of the lumbar spine.     [place]    Nerve / Sites Muscle Latency Ref. Amplitude Ref. Rel Amp Segments Distance Velocity Ref. Area    ms ms mV mV %  cm m/s m/s mVms  R Peroneal - EDB     Ankle EDB 4.9 ?6.5 4.5 ?2.0 100 Ankle - EDB 9   13.7     Fib head EDB 10.6  2.9  64.5 Fib head - Ankle 27 48 ?44 11.1     Pop fossa EDB 12.6  4.2  145 Pop fossa - Fib head 10 51 ?44 12.6         Pop fossa - Ankle      R Tibial - AH     Ankle AH 5.0 ?5.8 6.8 ?4.0 100 Ankle - AH 9   12.9     Pop fossa AH 14.1  2.1  31.6 Pop fossa - Ankle 38 41 ?41 6.5         SNC    Nerve / Sites Rec. Site Peak Lat Ref.  Amp Ref. Segments Distance    ms ms V V  cm  R Sural - Ankle (Calf)     Calf Ankle 3.6 ?4.4 17 ?6 Calf - Ankle 14  R Superficial peroneal - Ankle     Lat leg Ankle 3.8 ?4.4 9 ?6 Lat leg - Ankle 14         F  Wave    Nerve F Lat Ref.   ms ms  R Tibial - AH 53.6 ?56.0

## 2019-11-26 NOTE — Progress Notes (Signed)
Please refer to EMG and nerve conduction procedure note.  

## 2019-12-01 ENCOUNTER — Other Ambulatory Visit: Payer: Self-pay | Admitting: Neurology

## 2019-12-01 ENCOUNTER — Telehealth: Payer: Self-pay | Admitting: Neurology

## 2019-12-01 NOTE — Telephone Encounter (Signed)
[employer] medicare order sent to [facility]. They will obtain the auth and reach out to the patient to schedule.  °

## 2019-12-02 DIAGNOSIS — E782 Mixed hyperlipidemia: Secondary | ICD-10-CM | POA: Diagnosis not present

## 2019-12-02 DIAGNOSIS — E7849 Other hyperlipidemia: Secondary | ICD-10-CM | POA: Diagnosis not present

## 2019-12-02 DIAGNOSIS — I10 Essential (primary) hypertension: Secondary | ICD-10-CM | POA: Diagnosis not present

## 2019-12-02 DIAGNOSIS — K219 Gastro-esophageal reflux disease without esophagitis: Secondary | ICD-10-CM | POA: Diagnosis not present

## 2019-12-07 ENCOUNTER — Other Ambulatory Visit: Payer: Self-pay

## 2019-12-07 ENCOUNTER — Ambulatory Visit
Admission: RE | Admit: 2019-12-07 | Discharge: 2019-12-07 | Disposition: A | Discharge status: Home / Self Care | Payer: Medicare HMO | Source: Ambulatory Visit | Attending: Neurology | Admitting: Neurology

## 2019-12-07 DIAGNOSIS — M5417 Radiculopathy, lumbosacral region: Secondary | ICD-10-CM

## 2019-12-08 ENCOUNTER — Telehealth: Payer: Self-pay | Admitting: Neurology

## 2019-12-08 ENCOUNTER — Other Ambulatory Visit: Payer: Self-pay | Admitting: Neurology

## 2019-12-08 DIAGNOSIS — M48061 Spinal stenosis, lumbar region without neurogenic claudication: Secondary | ICD-10-CM

## 2019-12-08 NOTE — Telephone Encounter (Signed)
  I called the patient.  The patient has significant spinal stenosis at the L1-2, L2-3, and L3-4 levels, the discomfort she is having in the foot is likely related to the back.  I will try an epidural steroid injection, if this is not helpful in the medications she is taking are not helpful, we may consider a surgical referral.  MRI lumbar 12/07/19:  IMPRESSION: This MRI of the lumbar spine without contrast shows the following: 1.   There is severe spinal stenosis at L1-L2, L2-L3 and L3-L4 and moderate spinal stenosis at L4-L5 and L5-S1 due to various combinations of disc protrusion/herniation, facet hypertrophy and ligamenta flava hypertrophy.  Due to the severity of the spinal stenosis, there is probable nerve root compression at multiple levels. 2.    There is a large renal cyst at the upper pole of the left kidney.

## 2019-12-15 ENCOUNTER — Telehealth: Payer: Self-pay | Admitting: Emergency Medicine

## 2019-12-15 NOTE — Telephone Encounter (Signed)
Received notification of preapproval for [facility] from [place] [facility].  Authorization valid from 12/11/19 through 06/08/2020.  Reference # [id number], Authorization # [id number]

## 2019-12-18 ENCOUNTER — Other Ambulatory Visit: Payer: Self-pay

## 2019-12-18 ENCOUNTER — Ambulatory Visit
Admission: RE | Admit: 2019-12-18 | Discharge: 2019-12-18 | Disposition: A | Discharge status: Home / Self Care | Payer: Medicare HMO | Source: Ambulatory Visit | Attending: Neurology | Admitting: Neurology

## 2019-12-18 DIAGNOSIS — M47817 Spondylosis without myelopathy or radiculopathy, lumbosacral region: Secondary | ICD-10-CM | POA: Diagnosis not present

## 2019-12-18 DIAGNOSIS — M48061 Spinal stenosis, lumbar region without neurogenic claudication: Secondary | ICD-10-CM

## 2019-12-18 MED ORDER — METHYLPREDNISOLONE ACETATE 40 MG/ML INJ SUSP (RADIOLOG
120.0000 mg | Freq: Once | INTRAMUSCULAR | Status: AC
  Administered 2019-12-18: 120 mg via EPIDURAL

## 2019-12-18 MED ORDER — IOPAMIDOL (ISOVUE-M 200) INJECTION 41%
1.0000 mL | Freq: Once | INTRAMUSCULAR | Status: AC
  Administered 2019-12-18: 1 mL via EPIDURAL

## 2019-12-18 NOTE — Discharge Instructions (Signed)

## 2020-01-16 ENCOUNTER — Telehealth: Payer: Self-pay

## 2020-01-16 NOTE — Telephone Encounter (Signed)
Does not want more injections

## 2020-01-21 ENCOUNTER — Telehealth: Payer: Self-pay | Admitting: Neurology

## 2020-01-21 DIAGNOSIS — M48061 Spinal stenosis, lumbar region without neurogenic claudication: Secondary | ICD-10-CM

## 2020-01-21 NOTE — Telephone Encounter (Signed)
I called patient.  The patient Amy Newman that the epidural steroid injection did help her low back pain but not her toe pain which she is really concerned about.  She has multilevel spinal stenosis on MRI of the low back, she wishes to have referral to a surgeon and I will get this set up.

## 2020-01-21 NOTE — Addendum Note (Signed)
Addended by: Kathrynn Ducking on: 01/21/2020 01:51 PM   Modules accepted: Orders

## 2020-01-21 NOTE — Telephone Encounter (Signed)
Pt. states epidural did not work & as doctor requested for her to let him know, she is asking which surgeon does doctor recommend. Please advise.

## 2020-01-22 DIAGNOSIS — M7071 Other bursitis of hip, right hip: Secondary | ICD-10-CM | POA: Diagnosis not present

## 2020-01-22 DIAGNOSIS — E6609 Other obesity due to excess calories: Secondary | ICD-10-CM | POA: Diagnosis not present

## 2020-01-22 DIAGNOSIS — M5418 Radiculopathy, sacral and sacrococcygeal region: Secondary | ICD-10-CM | POA: Diagnosis not present

## 2020-01-22 DIAGNOSIS — R42 Dizziness and giddiness: Secondary | ICD-10-CM | POA: Diagnosis not present

## 2020-01-22 DIAGNOSIS — Z6834 Body mass index (BMI) 34.0-34.9, adult: Secondary | ICD-10-CM | POA: Diagnosis not present

## 2020-01-27 DIAGNOSIS — M48062 Spinal stenosis, lumbar region with neurogenic claudication: Secondary | ICD-10-CM | POA: Diagnosis not present

## 2020-01-27 DIAGNOSIS — M4316 Spondylolisthesis, lumbar region: Secondary | ICD-10-CM | POA: Diagnosis not present

## 2020-01-31 DIAGNOSIS — E7849 Other hyperlipidemia: Secondary | ICD-10-CM | POA: Diagnosis not present

## 2020-01-31 DIAGNOSIS — K219 Gastro-esophageal reflux disease without esophagitis: Secondary | ICD-10-CM | POA: Diagnosis not present

## 2020-01-31 DIAGNOSIS — I10 Essential (primary) hypertension: Secondary | ICD-10-CM | POA: Diagnosis not present

## 2020-01-31 DIAGNOSIS — E782 Mixed hyperlipidemia: Secondary | ICD-10-CM | POA: Diagnosis not present

## 2020-02-03 ENCOUNTER — Other Ambulatory Visit: Payer: Self-pay | Admitting: Neurology

## 2020-02-18 DIAGNOSIS — R293 Abnormal posture: Secondary | ICD-10-CM | POA: Diagnosis not present

## 2020-02-18 DIAGNOSIS — R531 Weakness: Secondary | ICD-10-CM | POA: Diagnosis not present

## 2020-02-18 DIAGNOSIS — R2689 Other abnormalities of gait and mobility: Secondary | ICD-10-CM | POA: Diagnosis not present

## 2020-02-18 DIAGNOSIS — M545 Low back pain, unspecified: Secondary | ICD-10-CM | POA: Diagnosis not present

## 2020-02-24 DIAGNOSIS — M545 Low back pain, unspecified: Secondary | ICD-10-CM | POA: Diagnosis not present

## 2020-02-24 DIAGNOSIS — R531 Weakness: Secondary | ICD-10-CM | POA: Diagnosis not present

## 2020-02-24 DIAGNOSIS — R293 Abnormal posture: Secondary | ICD-10-CM | POA: Diagnosis not present

## 2020-02-24 DIAGNOSIS — R2689 Other abnormalities of gait and mobility: Secondary | ICD-10-CM | POA: Diagnosis not present

## 2020-02-26 DIAGNOSIS — K219 Gastro-esophageal reflux disease without esophagitis: Secondary | ICD-10-CM | POA: Diagnosis not present

## 2020-02-26 DIAGNOSIS — Z1389 Encounter for screening for other disorder: Secondary | ICD-10-CM | POA: Diagnosis not present

## 2020-02-26 DIAGNOSIS — Z6834 Body mass index (BMI) 34.0-34.9, adult: Secondary | ICD-10-CM | POA: Diagnosis not present

## 2020-02-26 DIAGNOSIS — M150 Primary generalized (osteo)arthritis: Secondary | ICD-10-CM | POA: Diagnosis not present

## 2020-02-26 DIAGNOSIS — I10 Essential (primary) hypertension: Secondary | ICD-10-CM | POA: Diagnosis not present

## 2020-02-26 DIAGNOSIS — Z0000 Encounter for general adult medical examination without abnormal findings: Secondary | ICD-10-CM | POA: Diagnosis not present

## 2020-02-27 DIAGNOSIS — R293 Abnormal posture: Secondary | ICD-10-CM | POA: Diagnosis not present

## 2020-02-27 DIAGNOSIS — R531 Weakness: Secondary | ICD-10-CM | POA: Diagnosis not present

## 2020-02-27 DIAGNOSIS — R2689 Other abnormalities of gait and mobility: Secondary | ICD-10-CM | POA: Diagnosis not present

## 2020-02-27 DIAGNOSIS — M545 Low back pain, unspecified: Secondary | ICD-10-CM | POA: Diagnosis not present

## 2020-03-01 DIAGNOSIS — K219 Gastro-esophageal reflux disease without esophagitis: Secondary | ICD-10-CM | POA: Diagnosis not present

## 2020-03-01 DIAGNOSIS — E7849 Other hyperlipidemia: Secondary | ICD-10-CM | POA: Diagnosis not present

## 2020-03-01 DIAGNOSIS — I10 Essential (primary) hypertension: Secondary | ICD-10-CM | POA: Diagnosis not present

## 2020-03-01 DIAGNOSIS — E782 Mixed hyperlipidemia: Secondary | ICD-10-CM | POA: Diagnosis not present

## 2020-03-02 DIAGNOSIS — I10 Essential (primary) hypertension: Secondary | ICD-10-CM | POA: Diagnosis not present

## 2020-03-02 DIAGNOSIS — M48062 Spinal stenosis, lumbar region with neurogenic claudication: Secondary | ICD-10-CM | POA: Diagnosis not present

## 2020-03-02 DIAGNOSIS — Z6834 Body mass index (BMI) 34.0-34.9, adult: Secondary | ICD-10-CM | POA: Diagnosis not present

## 2020-03-02 DIAGNOSIS — M4316 Spondylolisthesis, lumbar region: Secondary | ICD-10-CM | POA: Diagnosis not present

## 2020-03-03 ENCOUNTER — Other Ambulatory Visit: Payer: Self-pay | Admitting: Neurosurgery

## 2020-03-03 ENCOUNTER — Other Ambulatory Visit (HOSPITAL_COMMUNITY): Payer: Self-pay | Admitting: Neurosurgery

## 2020-03-03 DIAGNOSIS — R293 Abnormal posture: Secondary | ICD-10-CM | POA: Diagnosis not present

## 2020-03-03 DIAGNOSIS — M4316 Spondylolisthesis, lumbar region: Secondary | ICD-10-CM

## 2020-03-03 DIAGNOSIS — R531 Weakness: Secondary | ICD-10-CM | POA: Diagnosis not present

## 2020-03-03 DIAGNOSIS — M545 Low back pain, unspecified: Secondary | ICD-10-CM | POA: Diagnosis not present

## 2020-03-03 DIAGNOSIS — R2689 Other abnormalities of gait and mobility: Secondary | ICD-10-CM | POA: Diagnosis not present

## 2020-03-04 DIAGNOSIS — R2689 Other abnormalities of gait and mobility: Secondary | ICD-10-CM | POA: Diagnosis not present

## 2020-03-04 DIAGNOSIS — R531 Weakness: Secondary | ICD-10-CM | POA: Diagnosis not present

## 2020-03-04 DIAGNOSIS — R293 Abnormal posture: Secondary | ICD-10-CM | POA: Diagnosis not present

## 2020-03-04 DIAGNOSIS — M545 Low back pain, unspecified: Secondary | ICD-10-CM | POA: Diagnosis not present

## 2020-03-09 DIAGNOSIS — R531 Weakness: Secondary | ICD-10-CM | POA: Diagnosis not present

## 2020-03-09 DIAGNOSIS — R2689 Other abnormalities of gait and mobility: Secondary | ICD-10-CM | POA: Diagnosis not present

## 2020-03-09 DIAGNOSIS — M545 Low back pain, unspecified: Secondary | ICD-10-CM | POA: Diagnosis not present

## 2020-03-09 DIAGNOSIS — R293 Abnormal posture: Secondary | ICD-10-CM | POA: Diagnosis not present

## 2020-03-11 DIAGNOSIS — R2689 Other abnormalities of gait and mobility: Secondary | ICD-10-CM | POA: Diagnosis not present

## 2020-03-11 DIAGNOSIS — M545 Low back pain, unspecified: Secondary | ICD-10-CM | POA: Diagnosis not present

## 2020-03-11 DIAGNOSIS — R293 Abnormal posture: Secondary | ICD-10-CM | POA: Diagnosis not present

## 2020-03-11 DIAGNOSIS — R531 Weakness: Secondary | ICD-10-CM | POA: Diagnosis not present

## 2020-03-12 LAB — COLOGUARD

## 2020-03-16 ENCOUNTER — Ambulatory Visit: Payer: Medicare HMO | Admitting: Neurology

## 2020-03-16 DIAGNOSIS — R293 Abnormal posture: Secondary | ICD-10-CM | POA: Diagnosis not present

## 2020-03-16 DIAGNOSIS — M545 Low back pain, unspecified: Secondary | ICD-10-CM | POA: Diagnosis not present

## 2020-03-16 DIAGNOSIS — R2689 Other abnormalities of gait and mobility: Secondary | ICD-10-CM | POA: Diagnosis not present

## 2020-03-16 DIAGNOSIS — R531 Weakness: Secondary | ICD-10-CM | POA: Diagnosis not present

## 2020-03-18 DIAGNOSIS — R293 Abnormal posture: Secondary | ICD-10-CM | POA: Diagnosis not present

## 2020-03-18 DIAGNOSIS — R2689 Other abnormalities of gait and mobility: Secondary | ICD-10-CM | POA: Diagnosis not present

## 2020-03-18 DIAGNOSIS — R531 Weakness: Secondary | ICD-10-CM | POA: Diagnosis not present

## 2020-03-18 DIAGNOSIS — M545 Low back pain, unspecified: Secondary | ICD-10-CM | POA: Diagnosis not present

## 2020-03-23 DIAGNOSIS — M545 Low back pain, unspecified: Secondary | ICD-10-CM | POA: Diagnosis not present

## 2020-03-23 DIAGNOSIS — R2689 Other abnormalities of gait and mobility: Secondary | ICD-10-CM | POA: Diagnosis not present

## 2020-03-23 DIAGNOSIS — R293 Abnormal posture: Secondary | ICD-10-CM | POA: Diagnosis not present

## 2020-03-23 DIAGNOSIS — R531 Weakness: Secondary | ICD-10-CM | POA: Diagnosis not present

## 2020-03-25 DIAGNOSIS — M545 Low back pain, unspecified: Secondary | ICD-10-CM | POA: Diagnosis not present

## 2020-03-25 DIAGNOSIS — R293 Abnormal posture: Secondary | ICD-10-CM | POA: Diagnosis not present

## 2020-03-25 DIAGNOSIS — R2689 Other abnormalities of gait and mobility: Secondary | ICD-10-CM | POA: Diagnosis not present

## 2020-03-25 DIAGNOSIS — R531 Weakness: Secondary | ICD-10-CM | POA: Diagnosis not present

## 2020-03-30 DIAGNOSIS — R2689 Other abnormalities of gait and mobility: Secondary | ICD-10-CM | POA: Diagnosis not present

## 2020-03-30 DIAGNOSIS — R293 Abnormal posture: Secondary | ICD-10-CM | POA: Diagnosis not present

## 2020-03-30 DIAGNOSIS — R531 Weakness: Secondary | ICD-10-CM | POA: Diagnosis not present

## 2020-03-30 DIAGNOSIS — M545 Low back pain, unspecified: Secondary | ICD-10-CM | POA: Diagnosis not present

## 2020-03-31 DIAGNOSIS — I10 Essential (primary) hypertension: Secondary | ICD-10-CM | POA: Diagnosis not present

## 2020-03-31 DIAGNOSIS — K219 Gastro-esophageal reflux disease without esophagitis: Secondary | ICD-10-CM | POA: Diagnosis not present

## 2020-03-31 DIAGNOSIS — E7849 Other hyperlipidemia: Secondary | ICD-10-CM | POA: Diagnosis not present

## 2020-04-01 DIAGNOSIS — R531 Weakness: Secondary | ICD-10-CM | POA: Diagnosis not present

## 2020-04-01 DIAGNOSIS — R2689 Other abnormalities of gait and mobility: Secondary | ICD-10-CM | POA: Diagnosis not present

## 2020-04-01 DIAGNOSIS — M545 Low back pain, unspecified: Secondary | ICD-10-CM | POA: Diagnosis not present

## 2020-04-01 DIAGNOSIS — R293 Abnormal posture: Secondary | ICD-10-CM | POA: Diagnosis not present

## 2020-04-02 ENCOUNTER — Other Ambulatory Visit (HOSPITAL_COMMUNITY): Payer: Self-pay | Admitting: Family Medicine

## 2020-04-02 DIAGNOSIS — Z1231 Encounter for screening mammogram for malignant neoplasm of breast: Secondary | ICD-10-CM

## 2020-04-06 ENCOUNTER — Ambulatory Visit (HOSPITAL_COMMUNITY)
Admission: RE | Admit: 2020-04-06 | Discharge: 2020-04-06 | Disposition: A | Discharge status: Home / Self Care | Payer: Medicare HMO | Source: Ambulatory Visit | Attending: Neurosurgery | Admitting: Neurosurgery

## 2020-04-06 ENCOUNTER — Other Ambulatory Visit: Payer: Self-pay

## 2020-04-06 DIAGNOSIS — M4316 Spondylolisthesis, lumbar region: Secondary | ICD-10-CM | POA: Insufficient documentation

## 2020-04-06 DIAGNOSIS — M545 Low back pain, unspecified: Secondary | ICD-10-CM | POA: Diagnosis not present

## 2020-04-13 DIAGNOSIS — M48062 Spinal stenosis, lumbar region with neurogenic claudication: Secondary | ICD-10-CM | POA: Diagnosis not present

## 2020-05-01 DIAGNOSIS — K219 Gastro-esophageal reflux disease without esophagitis: Secondary | ICD-10-CM | POA: Diagnosis not present

## 2020-05-01 DIAGNOSIS — I10 Essential (primary) hypertension: Secondary | ICD-10-CM | POA: Diagnosis not present

## 2020-05-01 DIAGNOSIS — E7849 Other hyperlipidemia: Secondary | ICD-10-CM | POA: Diagnosis not present

## 2020-05-11 DIAGNOSIS — H524 Presbyopia: Secondary | ICD-10-CM | POA: Diagnosis not present

## 2020-05-11 DIAGNOSIS — H40033 Anatomical narrow angle, bilateral: Secondary | ICD-10-CM | POA: Diagnosis not present

## 2020-05-11 DIAGNOSIS — H5319 Other subjective visual disturbances: Secondary | ICD-10-CM | POA: Diagnosis not present

## 2020-05-11 DIAGNOSIS — H401131 Primary open-angle glaucoma, bilateral, mild stage: Secondary | ICD-10-CM | POA: Diagnosis not present

## 2020-05-11 DIAGNOSIS — H2513 Age-related nuclear cataract, bilateral: Secondary | ICD-10-CM | POA: Diagnosis not present

## 2020-05-12 ENCOUNTER — Ambulatory Visit (HOSPITAL_COMMUNITY)
Admission: RE | Admit: 2020-05-12 | Discharge: 2020-05-12 | Disposition: A | Discharge status: Home / Self Care | Payer: Medicare HMO | Source: Ambulatory Visit | Attending: Family Medicine | Admitting: Family Medicine

## 2020-05-12 DIAGNOSIS — Z1231 Encounter for screening mammogram for malignant neoplasm of breast: Secondary | ICD-10-CM | POA: Insufficient documentation

## 2020-05-17 ENCOUNTER — Other Ambulatory Visit (HOSPITAL_COMMUNITY): Payer: Self-pay | Admitting: Family Medicine

## 2020-05-17 DIAGNOSIS — R928 Other abnormal and inconclusive findings on diagnostic imaging of breast: Secondary | ICD-10-CM

## 2020-06-01 DIAGNOSIS — E7849 Other hyperlipidemia: Secondary | ICD-10-CM | POA: Diagnosis not present

## 2020-06-01 DIAGNOSIS — K219 Gastro-esophageal reflux disease without esophagitis: Secondary | ICD-10-CM | POA: Diagnosis not present

## 2020-06-01 DIAGNOSIS — I10 Essential (primary) hypertension: Secondary | ICD-10-CM | POA: Diagnosis not present

## 2020-06-03 DIAGNOSIS — M25561 Pain in right knee: Secondary | ICD-10-CM | POA: Insufficient documentation

## 2020-06-03 DIAGNOSIS — M170 Bilateral primary osteoarthritis of knee: Secondary | ICD-10-CM | POA: Diagnosis not present

## 2020-06-07 DIAGNOSIS — R42 Dizziness and giddiness: Secondary | ICD-10-CM | POA: Diagnosis not present

## 2020-06-07 DIAGNOSIS — M545 Low back pain, unspecified: Secondary | ICD-10-CM | POA: Diagnosis not present

## 2020-06-07 DIAGNOSIS — E785 Hyperlipidemia, unspecified: Secondary | ICD-10-CM | POA: Diagnosis not present

## 2020-06-07 DIAGNOSIS — Z008 Encounter for other general examination: Secondary | ICD-10-CM | POA: Diagnosis not present

## 2020-06-07 DIAGNOSIS — Z6841 Body Mass Index (BMI) 40.0 and over, adult: Secondary | ICD-10-CM | POA: Diagnosis not present

## 2020-06-07 DIAGNOSIS — G8929 Other chronic pain: Secondary | ICD-10-CM | POA: Diagnosis not present

## 2020-06-07 DIAGNOSIS — H409 Unspecified glaucoma: Secondary | ICD-10-CM | POA: Diagnosis not present

## 2020-06-07 DIAGNOSIS — M1990 Unspecified osteoarthritis, unspecified site: Secondary | ICD-10-CM | POA: Diagnosis not present

## 2020-06-07 DIAGNOSIS — K219 Gastro-esophageal reflux disease without esophagitis: Secondary | ICD-10-CM | POA: Diagnosis not present

## 2020-06-07 DIAGNOSIS — I10 Essential (primary) hypertension: Secondary | ICD-10-CM | POA: Diagnosis not present

## 2020-06-15 ENCOUNTER — Other Ambulatory Visit (HOSPITAL_COMMUNITY): Payer: Self-pay | Admitting: Family Medicine

## 2020-06-15 ENCOUNTER — Ambulatory Visit (HOSPITAL_COMMUNITY)
Admission: RE | Admit: 2020-06-15 | Discharge: 2020-06-15 | Disposition: A | Discharge status: Home / Self Care | Payer: Medicare HMO | Source: Ambulatory Visit | Attending: Family Medicine | Admitting: Family Medicine

## 2020-06-15 ENCOUNTER — Other Ambulatory Visit: Payer: Self-pay

## 2020-06-15 DIAGNOSIS — R928 Other abnormal and inconclusive findings on diagnostic imaging of breast: Secondary | ICD-10-CM

## 2020-06-15 DIAGNOSIS — R921 Mammographic calcification found on diagnostic imaging of breast: Secondary | ICD-10-CM | POA: Diagnosis not present

## 2020-06-15 DIAGNOSIS — R922 Inconclusive mammogram: Secondary | ICD-10-CM | POA: Diagnosis not present

## 2020-06-23 ENCOUNTER — Ambulatory Visit
Admission: RE | Admit: 2020-06-23 | Discharge: 2020-06-23 | Disposition: A | Discharge status: Home / Self Care | Payer: Medicare HMO | Source: Ambulatory Visit | Attending: Family Medicine | Admitting: Family Medicine

## 2020-06-23 DIAGNOSIS — R928 Other abnormal and inconclusive findings on diagnostic imaging of breast: Secondary | ICD-10-CM

## 2020-06-23 DIAGNOSIS — N6091 Unspecified benign mammary dysplasia of right breast: Secondary | ICD-10-CM | POA: Diagnosis not present

## 2020-06-23 DIAGNOSIS — R921 Mammographic calcification found on diagnostic imaging of breast: Secondary | ICD-10-CM

## 2020-06-24 ENCOUNTER — Telehealth: Payer: Self-pay

## 2020-07-01 DIAGNOSIS — E7849 Other hyperlipidemia: Secondary | ICD-10-CM | POA: Diagnosis not present

## 2020-07-01 DIAGNOSIS — I10 Essential (primary) hypertension: Secondary | ICD-10-CM | POA: Diagnosis not present

## 2020-07-01 DIAGNOSIS — K219 Gastro-esophageal reflux disease without esophagitis: Secondary | ICD-10-CM | POA: Diagnosis not present

## 2020-07-01 DIAGNOSIS — E782 Mixed hyperlipidemia: Secondary | ICD-10-CM | POA: Diagnosis not present

## 2020-07-08 DIAGNOSIS — M5431 Sciatica, right side: Secondary | ICD-10-CM | POA: Diagnosis not present

## 2020-07-08 DIAGNOSIS — K219 Gastro-esophageal reflux disease without esophagitis: Secondary | ICD-10-CM | POA: Diagnosis not present

## 2020-07-08 DIAGNOSIS — M7071 Other bursitis of hip, right hip: Secondary | ICD-10-CM | POA: Diagnosis not present

## 2020-07-08 DIAGNOSIS — E6609 Other obesity due to excess calories: Secondary | ICD-10-CM | POA: Diagnosis not present

## 2020-07-08 DIAGNOSIS — Z6833 Body mass index (BMI) 33.0-33.9, adult: Secondary | ICD-10-CM | POA: Diagnosis not present

## 2020-07-08 DIAGNOSIS — I10 Essential (primary) hypertension: Secondary | ICD-10-CM | POA: Diagnosis not present

## 2020-07-13 DIAGNOSIS — H40033 Anatomical narrow angle, bilateral: Secondary | ICD-10-CM | POA: Diagnosis not present

## 2020-07-13 DIAGNOSIS — H401131 Primary open-angle glaucoma, bilateral, mild stage: Secondary | ICD-10-CM | POA: Diagnosis not present

## 2020-07-29 DIAGNOSIS — Z6833 Body mass index (BMI) 33.0-33.9, adult: Secondary | ICD-10-CM | POA: Diagnosis not present

## 2020-07-29 DIAGNOSIS — M170 Bilateral primary osteoarthritis of knee: Secondary | ICD-10-CM | POA: Diagnosis not present

## 2020-07-29 DIAGNOSIS — M7071 Other bursitis of hip, right hip: Secondary | ICD-10-CM | POA: Diagnosis not present

## 2020-07-29 DIAGNOSIS — G5791 Unspecified mononeuropathy of right lower limb: Secondary | ICD-10-CM | POA: Diagnosis not present

## 2020-07-29 DIAGNOSIS — E6609 Other obesity due to excess calories: Secondary | ICD-10-CM | POA: Diagnosis not present

## 2020-08-02 DIAGNOSIS — M25562 Pain in left knee: Secondary | ICD-10-CM | POA: Diagnosis not present

## 2020-08-02 DIAGNOSIS — M25662 Stiffness of left knee, not elsewhere classified: Secondary | ICD-10-CM | POA: Diagnosis not present

## 2020-08-02 DIAGNOSIS — M1712 Unilateral primary osteoarthritis, left knee: Secondary | ICD-10-CM | POA: Diagnosis not present

## 2020-08-04 DIAGNOSIS — N6091 Unspecified benign mammary dysplasia of right breast: Secondary | ICD-10-CM | POA: Diagnosis not present

## 2020-08-09 NOTE — Progress Notes (Signed)
DUE TO COVID-19 ONLY ONE VISITOR IS ALLOWED TO COME WITH YOU AND STAY IN THE WAITING ROOM ONLY DURING PRE OP AND PROCEDURE DAY OF SURGERY.  2 VISITOR  MAY VISIT WITH YOU AFTER SURGERY IN YOUR PRIVATE ROOM DURING VISITING HOURS ONLY!  YOU NEED TO HAVE A COVID 19 TEST ON__8/18/2022 ____'@_'$  '@_from'$  8am-3pm _____, THIS TEST MUST BE DONE BEFORE SURGERY,  Covid test is done at [place], [place] [address].  This is a drive thru.  No appt required. Please see map.                 Your procedure is scheduled on:           08/23/2020   Report to [facility] Main  Entrance   Report to admitting at     0900AM     Call this number if you have problems the morning of surgery [phone]    REMEMBER: NO  SOLID FOOD CANDY OR GUM AFTER MIDNIGHT. CLEAR LIQUIDS UNTIL  0830am        . NOTHING BY MOUTH EXCEPT CLEAR LIQUIDS UNTIL   0830am    . PLEASE FINISH ENSURE DRINK PER SURGEON ORDER  WHICH NEEDS TO BE COMPLETED AT      .  0830am     CLEAR LIQUID DIET   Foods Allowed                                                                    Coffee and tea, regular and decaf                            Fruit ices (not with fruit pulp)                                      Iced Popsicles                                    Carbonated beverages, regular and diet                                    Cranberry, grape and apple juices Sports drinks like Gatorade Lightly seasoned clear broth or consume(fat free) Sugar, honey syrup ___________________________________________________________________      BRUSH YOUR TEETH MORNING OF SURGERY AND RINSE YOUR MOUTH OUT, NO CHEWING GUM CANDY OR MINTS.     Take these medicines the morning of surgery with A SIP OF WATER:   Ate nolol, tegretol, gabapentin, prilosec  DO NOT TAKE ANY DIABETIC MEDICATIONS DAY OF YOUR SURGERY                               You may not have any metal on your body including hair pins and              piercings  Do not  wear jewelry, make-up, lotions, powders or perfumes, deodorant  Do not wear nail polish on your fingernails.  Do not shave  48 hours prior to surgery.              Men may shave face and neck.   Do not bring valuables to the hospital. [place].  Contacts, dentures or bridgework may not be worn into surgery.  Leave suitcase in the car. After surgery it may be brought to your room.     Patients discharged the day of surgery will not be allowed to drive home. IF YOU ARE HAVING SURGERY AND GOING HOME THE SAME DAY, YOU MUST HAVE AN ADULT TO DRIVE YOU HOME AND BE WITH YOU FOR 24 HOURS. YOU MAY GO HOME BY TAXI OR UBER OR ORTHERWISE, BUT AN ADULT MUST ACCOMPANY YOU HOME AND STAY WITH YOU FOR 24 HOURS.  Name and phone number of your driver:  Special Instructions: N/A              Please read over the following fact sheets you were given: _____________________________________________________________________  [facility] - Preparing for Surgery Before surgery, you can play an important role.  Because skin is not sterile, your skin needs to be as free of germs as possible.  You can reduce the number of germs on your skin by washing with CHG (chlorahexidine gluconate) soap before surgery.  CHG is an antiseptic cleaner which kills germs and bonds with the skin to continue killing germs even after washing. Please DO NOT use if you have an allergy to CHG or antibacterial soaps.  If your skin becomes reddened/irritated stop using the CHG and inform your nurse when you arrive at Short Stay. Do not shave (including legs and underarms) for at least 48 hours prior to the first CHG shower.  You may shave your face/neck. Please follow these instructions carefully:  1.  Shower with CHG Soap the night before surgery and the  morning of Surgery.  2.  If you choose to wash your hair, wash your hair first as usual with your  normal  shampoo.  3.  After you  shampoo, rinse your hair and body thoroughly to remove the  shampoo.                           4.  Use CHG as you would any other liquid soap.  You can apply chg directly  to the skin and wash                       Gently with a scrungie or clean washcloth.  5.  Apply the CHG Soap to your body ONLY FROM THE NECK DOWN.   Do not use on face/ open                           Wound or open sores. Avoid contact with eyes, ears mouth and genitals (private parts).                       Wash face,  Genitals (private parts) with your normal soap.             6.  Wash thoroughly, paying special attention to the area where your surgery  will be performed.  7.  Thoroughly rinse your body with  warm water from the neck down.  8.  DO NOT shower/wash with your normal soap after using and rinsing off  the CHG Soap.                9.  Pat yourself dry with a clean towel.            10.  Wear clean pajamas.            11.  Place clean sheets on your bed the night of your first shower and do not  sleep with pets. Day of Surgery : Do not apply any lotions/deodorants the morning of surgery.  Please wear clean clothes to the hospital/surgery center.  FAILURE TO FOLLOW THESE INSTRUCTIONS MAY RESULT IN THE CANCELLATION OF YOUR SURGERY PATIENT SIGNATURE_________________________________  NURSE SIGNATURE__________________________________  ________________________________________________________________________

## 2020-08-11 ENCOUNTER — Encounter (HOSPITAL_COMMUNITY)
Admission: RE | Admit: 2020-08-11 | Discharge: 2020-08-11 | Disposition: A | Discharge status: Home / Self Care | Payer: Medicare HMO | Source: Ambulatory Visit | Attending: Orthopedic Surgery | Admitting: Orthopedic Surgery

## 2020-08-11 ENCOUNTER — Encounter (HOSPITAL_COMMUNITY): Payer: Self-pay

## 2020-08-11 ENCOUNTER — Other Ambulatory Visit: Payer: Self-pay

## 2020-08-11 DIAGNOSIS — I440 Atrioventricular block, first degree: Secondary | ICD-10-CM | POA: Diagnosis not present

## 2020-08-11 DIAGNOSIS — Z01818 Encounter for other preprocedural examination: Secondary | ICD-10-CM | POA: Diagnosis not present

## 2020-08-11 HISTORY — DX: Nausea with vomiting, unspecified: Z98.890

## 2020-08-11 HISTORY — DX: Other specified postprocedural states: Z98.890

## 2020-08-11 HISTORY — DX: Unspecified osteoarthritis, unspecified site: M19.90

## 2020-08-11 HISTORY — DX: Malignant (primary) neoplasm, unspecified: C80.1

## 2020-08-11 HISTORY — DX: Nausea with vomiting, unspecified: R11.2

## 2020-08-11 HISTORY — DX: Other specified postprocedural states: R11.2

## 2020-08-11 LAB — CBC
HCT: 40 % (ref 36.0–46.0)
Hemoglobin: 12.6 g/dL (ref 12.0–15.0)
MCH: 30.8 pg (ref 26.0–34.0)
MCHC: 31.5 g/dL (ref 30.0–36.0)
MCV: 97.8 fL (ref 80.0–100.0)
Platelets: 230 10*3/uL (ref 150–400)
RBC: 4.09 MIL/uL (ref 3.87–5.11)
RDW: 12.9 % (ref 11.5–15.5)
WBC: 5.8 10*3/uL (ref 4.0–10.5)
nRBC: 0 % (ref 0.0–0.2)

## 2020-08-11 LAB — TYPE AND SCREEN
ABO/RH(D): A POS
Antibody Screen: NEGATIVE

## 2020-08-11 LAB — COMPREHENSIVE METABOLIC PANEL
ALT: 17 U/L (ref 0–44)
AST: 18 U/L (ref 15–41)
Albumin: 3.9 g/dL (ref 3.5–5.0)
Alkaline Phosphatase: 68 U/L (ref 38–126)
Anion gap: 10 (ref 5–15)
BUN: 19 mg/dL (ref 8–23)
CO2: 23 mmol/L (ref 22–32)
Calcium: 9.3 mg/dL (ref 8.9–10.3)
Chloride: 108 mmol/L (ref 98–111)
Creatinine, Ser: 1.04 mg/dL — ABNORMAL HIGH (ref 0.44–1.00)
GFR, Estimated: 59 mL/min — ABNORMAL LOW (ref >60)
Glucose, Bld: 109 mg/dL — ABNORMAL HIGH (ref 70–99)
Potassium: 4.2 mmol/L (ref 3.5–5.1)
Sodium: 141 mmol/L (ref 135–145)
Total Bilirubin: 0.3 mg/dL (ref 0.3–1.2)
Total Protein: 7.3 g/dL (ref 6.5–8.1)

## 2020-08-11 LAB — SURGICAL PCR SCREEN
MRSA, PCR: NEGATIVE
Staphylococcus aureus: NEGATIVE

## 2020-08-11 LAB — PROTIME-INR
INR: 1 (ref 0.8–1.2)
Prothrombin Time: 12.8 seconds (ref 11.4–15.2)

## 2020-08-11 NOTE — Progress Notes (Addendum)
Anesthesia Review:  PCP:  Delman Cheadle- Clearance dated 06/07/20 on chart Requested office visit note 06/07/2020 and/later on this pt . [id number] Cardiologist : Chest x-ray : EKG : 08/11/20 Echo : Stress test: Cardiac Cath :  Activity level: cannot do a flight of stairs without difficulty  Sleep Study/ CPAP : no  Fasting Blood Sugar :      / Checks Blood Sugar -- times a day:   Blood Thinner/ Instructions /Last Dose: ASA / Instructions/ Last Dose :

## 2020-08-11 NOTE — Progress Notes (Signed)
AT time of preop appt pt stated she was not taking Omeprazole even though on med list.  Informed pt that I would call her once she arrived home and we would go over meds to clarify.  Called pt at home and she stated she was taking Omeprazole 20 mg daily in the am.  Instructed pt to take am of surgery.

## 2020-08-19 ENCOUNTER — Other Ambulatory Visit: Payer: Self-pay | Admitting: Orthopedic Surgery

## 2020-08-20 LAB — SARS CORONAVIRUS 2 (TAT 6-24 HRS): SARS Coronavirus 2: NEGATIVE

## 2020-08-22 MED ORDER — BUPIVACAINE LIPOSOME 1.3 % IJ SUSP
20.0000 mL | Freq: Once | INTRAMUSCULAR | Status: DC
  Filled 2020-08-22: qty 20

## 2020-08-22 NOTE — Anesthesia Preprocedure Evaluation (Addendum)
Anesthesia Evaluation  Patient identified by MRN, date of birth, ID band Patient awake    Reviewed: Allergy & Precautions, NPO status , Patient's Chart, lab work & pertinent test results, reviewed documented beta blocker date and time   History of Anesthesia Complications (+) PONV and history of anesthetic complications  Airway Mallampati: II  TM Distance: >3 FB Neck ROM: Full    Dental no notable dental hx. (+) Dental Advisory Given   Pulmonary neg pulmonary ROS,    Pulmonary exam normal        Cardiovascular hypertension, Pt. on medications and Pt. on home beta blockers Normal cardiovascular exam     Neuro/Psych negative neurological ROS     GI/Hepatic negative GI ROS, Neg liver ROS,   Endo/Other  negative endocrine ROS  Renal/GU negative Renal ROS     Musculoskeletal  (+) Arthritis ,   Abdominal   Peds  Hematology negative hematology ROS (+)   Anesthesia Other Findings   Reproductive/Obstetrics                            Anesthesia Physical Anesthesia Plan  ASA: 2  Anesthesia Plan: Spinal and MAC   Post-op Pain Management:  Regional for Post-op pain   Induction:   PONV Risk Score and Plan: 3 and Ondansetron, Dexamethasone, Midazolam and Propofol infusion  Airway Management Planned: Simple Face Mask  Additional Equipment:   Intra-op Plan:   Post-operative Plan:   Informed Consent: I have reviewed the patients History and Physical, chart, labs and discussed the procedure including the risks, benefits and alternatives for the proposed anesthesia with the patient or authorized representative who has indicated his/her understanding and acceptance.     Dental advisory given  Plan Discussed with: Anesthesiologist and CRNA  Anesthesia Plan Comments:       Anesthesia Quick Evaluation

## 2020-08-22 NOTE — H&P (Signed)
TOTAL KNEE ADMISSION H&P  Patient is being admitted for left total knee arthroplasty.  Subjective:  Chief Complaint: Left knee pain.  HPI: Amy Newman, 70 y.o. female has a history of pain and functional disability in the left knee due to arthritis and has failed non-surgical conservative treatments for greater than 12 weeks to include NSAID's and/or analgesics and activity modification. Onset of symptoms was gradual, starting  several  years ago with gradually worsening course since that time. The patient noted no past surgery on the left knee.  Patient currently rates pain in the left knee at 6 out of 10 with activity. Patient has worsening of pain with activity and weight bearing, pain that interferes with activities of daily living, pain with passive range of motion, and crepitus. Patient has evidence of periarticular osteophytes and joint space narrowing by imaging studies. There is no active infection.  Patient Active Problem List   Diagnosis Date Noted   Right foot pain 11/12/2019    Past Medical History:  Diagnosis Date   Arthritis    Cancer ([place])    cervical cancer   H/O: cesarean section    Hypertension    Obese    PONV (postoperative nausea and vomiting)     Past Surgical History:  Procedure Laterality Date   athroscopic knee surgery bilaterall Bilateral    CARPAL TUNNEL RELEASE Right    ROTATOR CUFF REPAIR Right     Prior to Admission medications   Medication Sig Start Date End Date Taking? Authorizing Provider  aspirin EC 81 MG tablet Take 81 mg by mouth daily. Swallow whole.   Yes [provider]  atenolol (TENORMIN) 50 MG tablet Take 50 mg by mouth daily. 08/05/19  Yes [provider]  atorvastatin (LIPITOR) 20 MG tablet Take 20 mg by mouth at bedtime. 08/02/19  Yes [provider]  brimonidine (ALPHAGAN) 0.2 % ophthalmic solution Place 1 drop into both eyes 2 (two) times daily. 07/13/20  Yes [provider]  Calcium  Citrate (CITRACAL PO) Take 1 tablet by mouth daily.   Yes [provider]  cetirizine (ZYRTEC) 10 MG tablet Take 10 mg by mouth daily.   Yes [provider]  ibuprofen (ADVIL) 800 MG tablet Take 800 mg by mouth 2 (two) times daily.   Yes [provider]  latanoprost (XALATAN) 0.005 % ophthalmic solution Place 1 drop into both eyes at bedtime. 07/13/20  Yes [provider]  meclizine (ANTIVERT) 25 MG tablet Take 25 mg by mouth every 6 (six) hours as needed for dizziness.   Yes [provider]  gabapentin (NEURONTIN) 400 MG capsule Take 1 capsule (400 mg total) by mouth 3 (three) times daily. Patient not taking: No sig reported 11/12/19   Kathrynn Ducking, MD  lidocaine (LIDODERM) 5 % Place 1 patch onto the skin daily. 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  Smoking status: Never   Smokeless tobacco: Never  Vaping Use   Vaping Use: Never used  Substance and Sexual Activity   Alcohol use: Never   Drug use: Never   Sexual activity: Not on file  Other Topics Concern   Not on file  Social History Narrative   Lives alone   Right Handed   Drinks 1 can soda on Sunday   Social Determinants of Health   Financial Resource Strain: Not on file  Food Insecurity: Not on file  Transportation Needs: Not on file  Physical Activity: Not on file  Stress: Not on file  Social Connections: Not on file  Intimate Partner Violence: Not on file     Tobacco Use: Low Risk    Smoking Tobacco Use: Never   Smokeless Tobacco Use: Never   Social History   Substance and Sexual Activity  Alcohol Use Never    Family History  Problem Relation Age of Onset   Stroke Mother    Diabetes Mother    Cancer Father    Cancer Sister        breast   Diabetes Brother    Peripheral Artery Disease Brother     ROS: Constitutional: no fever, no chills, no night sweats, no significant weight loss Cardiovascular: no chest pain, no palpitations Respiratory: no cough, no shortness of breath, No COPD Gastrointestinal: no vomiting, no nausea Musculoskeletal: no swelling in Joints, Joint Pain Neurologic: no numbness, no tingling, no difficulty with balance   Objective:  Physical Exam: Well nourished and well developed.  General: Alert and oriented x3, cooperative and pleasant, no acute distress.  Head: normocephalic, atraumatic, neck supple.  Eyes: EOMI.  Respiratory: breath sounds clear in all fields, no wheezing, rales, or rhonchi. Cardiovascular: Regular rate and rhythm, no murmurs, gallops or rubs.  Abdomen: non-tender to palpation and soft, normoactive bowel sounds. Musculoskeletal:   The patient has a significantly antalgic gait pattern due to the bilateral varus deformities.     Bilateral Hip Exam:   The range of motion: normal without discomfort.     Right Knee Exam:   No effusion present. No swelling present.   The range of motion is: 5 to 90 degrees.   Moderate crepitus on range of motion of the knee.   Positive medial greater than lateral joint line tenderness.   The knee is stable.     Left Knee Exam:   Significant varus deformity.   No effusion present. No swelling present.   The Range of motion is: 10 to 85 degrees with a firm endpoint on flexion.   Marked crepitus on range of motion of the knee.   Positive medial greater than than lateral joint line tenderness.   The knee is stable.   Calves soft and nontender.  Motor function intact in LE. Strength 5/5 LE bilaterally. Neuro: Distal pulses 2+. Sensation to light touch intact in LE.    Vital signs in last 24 hours:    Imaging Review Radiographs- AP and lateral of the bilateral knees from today demonstrate severe end-stage arthritis in the bilateral knees in the medial and patellofemoral compartments with varus deformity and severe osteophyte formation throughout the bilateral knees.  Assessment/Plan:  End stage arthritis, left knee   The patient history, physical examination, clinical judgment of the provider and imaging studies are consistent with end stage degenerative joint disease of the left knee and total knee arthroplasty is deemed medically necessary. The treatment options including medical management, injection therapy arthroscopy and  arthroplasty were discussed at length. The risks and benefits of total knee arthroplasty were presented and reviewed. The risks due to aseptic loosening, infection, stiffness, patella tracking problems, thromboembolic complications and other imponderables were discussed. The patient acknowledged the explanation, agreed to proceed with the plan and consent was signed. Patient is being admitted for inpatient treatment for surgery, pain control, PT, OT, prophylactic antibiotics, VTE prophylaxis, progressive ambulation and ADLs and discharge planning. The patient is planning to be discharged  home .   Patient's anticipated LOS is less than 2 midnights, meeting these requirements: - Younger than 52 - Lives within 1 hour of care - Has a competent adult at home to recover with post-op recover - NO history of  - Chronic pain requiring opiods  - Diabetes  - Coronary Artery Disease  - Heart failure  - Heart attack  - Stroke  - DVT/VTE  - Cardiac arrhythmia  - Respiratory Failure/COPD  - Renal failure  - Anemia  - Advanced Liver disease    Therapy Plans: [facility] in [place] Disposition: Home with her  Son Planned DVT Prophylaxis: Aspirin '325mg'$  DME Needed: Gilford Rile PCP: Delman Cheadle, PA-C (clearance received) TXA: IV Allergies: PCN (Rash/Swelling) Anesthesia Concerns: N/V following all anesthesia in the past BMI: 37.2 Last HgbA1c: n/a  Pharmacy: [facility] [address] [facility] [place]  Other: Has intermittent vertigo that is unpredictable.   - Patient was instructed on what medications to stop prior to surgery. - Follow-up visit in 2 weeks with Dr. Wynelle Link - Begin physical therapy following surgery - Pre-operative lab work as pre-surgical testing - Prescriptions will be provided in hospital at time of discharge  Fenton Foy, [facility], PA-C Orthopedic Surgery [facility] [place]

## 2020-08-23 ENCOUNTER — Ambulatory Visit (HOSPITAL_COMMUNITY): Payer: Medicare HMO | Admitting: Physician Assistant

## 2020-08-23 ENCOUNTER — Encounter (HOSPITAL_COMMUNITY): Payer: Self-pay | Admitting: Orthopedic Surgery

## 2020-08-23 ENCOUNTER — Ambulatory Visit (HOSPITAL_COMMUNITY): Payer: Medicare HMO | Admitting: Certified Registered Nurse Anesthetist

## 2020-08-23 ENCOUNTER — Encounter (HOSPITAL_COMMUNITY)
Admission: RE | Disposition: A | Discharge status: Home / Self Care | Payer: Self-pay | Source: Home / Self Care | Attending: Orthopedic Surgery

## 2020-08-23 ENCOUNTER — Observation Stay (HOSPITAL_COMMUNITY)
Admission: RE | Admit: 2020-08-23 | Discharge: 2020-08-24 | Disposition: A | Discharge status: Home / Self Care | Payer: Medicare HMO | Attending: Orthopedic Surgery | Admitting: Orthopedic Surgery

## 2020-08-23 ENCOUNTER — Other Ambulatory Visit: Payer: Self-pay

## 2020-08-23 DIAGNOSIS — Z8541 Personal history of malignant neoplasm of cervix uteri: Secondary | ICD-10-CM | POA: Insufficient documentation

## 2020-08-23 DIAGNOSIS — I10 Essential (primary) hypertension: Secondary | ICD-10-CM | POA: Insufficient documentation

## 2020-08-23 DIAGNOSIS — Z7982 Long term (current) use of aspirin: Secondary | ICD-10-CM | POA: Insufficient documentation

## 2020-08-23 DIAGNOSIS — Z79899 Other long term (current) drug therapy: Secondary | ICD-10-CM | POA: Diagnosis not present

## 2020-08-23 DIAGNOSIS — M179 Osteoarthritis of knee, unspecified: Secondary | ICD-10-CM | POA: Diagnosis present

## 2020-08-23 DIAGNOSIS — M1712 Unilateral primary osteoarthritis, left knee: Secondary | ICD-10-CM | POA: Diagnosis not present

## 2020-08-23 DIAGNOSIS — G8918 Other acute postprocedural pain: Secondary | ICD-10-CM | POA: Diagnosis not present

## 2020-08-23 DIAGNOSIS — C539 Malignant neoplasm of cervix uteri, unspecified: Secondary | ICD-10-CM | POA: Diagnosis not present

## 2020-08-23 DIAGNOSIS — M1710 Unilateral primary osteoarthritis, unspecified knee: Secondary | ICD-10-CM

## 2020-08-23 HISTORY — PX: TOTAL KNEE ARTHROPLASTY: SHX125

## 2020-08-23 LAB — ABO/RH: ABO/RH(D): A POS

## 2020-08-23 SURGERY — ARTHROPLASTY, KNEE, TOTAL
Anesthesia: Monitor Anesthesia Care | Site: Knee | Laterality: Left

## 2020-08-23 MED ORDER — DEXAMETHASONE SODIUM PHOSPHATE 10 MG/ML IJ SOLN
INTRAMUSCULAR | Status: AC
  Filled 2020-08-23: qty 1

## 2020-08-23 MED ORDER — 0.9 % SODIUM CHLORIDE (POUR BTL) OPTIME
TOPICAL | Status: DC | PRN
  Administered 2020-08-23: 1000 mL

## 2020-08-23 MED ORDER — OXYCODONE HCL 5 MG PO TABS
5.0000 mg | ORAL_TABLET | ORAL | Status: DC | PRN
  Administered 2020-08-23 – 2020-08-24 (×3): 5 mg via ORAL
  Filled 2020-08-23 (×3): qty 1

## 2020-08-23 MED ORDER — SODIUM CHLORIDE 0.9 % IV SOLN: INTRAVENOUS | Status: DC

## 2020-08-23 MED ORDER — PROPOFOL 500 MG/50ML IV EMUL
INTRAVENOUS | Status: DC | PRN
  Administered 2020-08-23: 50 ug/kg/min via INTRAVENOUS

## 2020-08-23 MED ORDER — ONDANSETRON HCL 4 MG/2ML IJ SOLN
INTRAMUSCULAR | Status: AC
  Filled 2020-08-23: qty 2

## 2020-08-23 MED ORDER — METHOCARBAMOL 1000 MG/10ML IJ SOLN
500.0000 mg | Freq: Four times a day (QID) | INTRAVENOUS | Status: DC | PRN
  Filled 2020-08-23: qty 5

## 2020-08-23 MED ORDER — MIDAZOLAM HCL 2 MG/2ML IJ SOLN
1.0000 mg | INTRAMUSCULAR | Status: DC
  Administered 2020-08-23: 2 mg via INTRAVENOUS
  Filled 2020-08-23: qty 2

## 2020-08-23 MED ORDER — AMISULPRIDE (ANTIEMETIC) 5 MG/2ML IV SOLN: 10.0000 mg | Freq: Once | INTRAVENOUS | Status: DC | PRN

## 2020-08-23 MED ORDER — OXYCODONE HCL 5 MG PO TABS
10.0000 mg | ORAL_TABLET | ORAL | Status: DC | PRN
Start: 2020-08-23 — End: 2020-08-24

## 2020-08-23 MED ORDER — PROPOFOL 1000 MG/100ML IV EMUL
INTRAVENOUS | Status: AC
  Filled 2020-08-23: qty 100

## 2020-08-23 MED ORDER — EPHEDRINE SULFATE-NACL 50-0.9 MG/10ML-% IV SOSY
PREFILLED_SYRINGE | INTRAVENOUS | Status: DC | PRN
  Administered 2020-08-23 (×2): 10 mg via INTRAVENOUS

## 2020-08-23 MED ORDER — TRANEXAMIC ACID-NACL 1000-0.7 MG/100ML-% IV SOLN
1000.0000 mg | INTRAVENOUS | Status: AC
  Administered 2020-08-23: 1000 mg via INTRAVENOUS
  Filled 2020-08-23: qty 100

## 2020-08-23 MED ORDER — LATANOPROST 0.005 % OP SOLN
1.0000 [drp] | Freq: Every day | OPHTHALMIC | Status: DC
  Administered 2020-08-23: 1 [drp] via OPHTHALMIC
  Filled 2020-08-23: qty 2.5

## 2020-08-23 MED ORDER — ATORVASTATIN CALCIUM 20 MG PO TABS
20.0000 mg | ORAL_TABLET | Freq: Every day | ORAL | Status: DC
  Filled 2020-08-23: qty 1

## 2020-08-23 MED ORDER — BUPIVACAINE IN DEXTROSE 0.75-8.25 % IT SOLN
INTRATHECAL | Status: DC | PRN
  Administered 2020-08-23: 1.6 mL via INTRATHECAL

## 2020-08-23 MED ORDER — ONDANSETRON HCL 4 MG/2ML IJ SOLN
INTRAMUSCULAR | Status: DC | PRN
  Administered 2020-08-23: 4 mg via INTRAVENOUS

## 2020-08-23 MED ORDER — SODIUM CHLORIDE (PF) 0.9 % IJ SOLN
INTRAMUSCULAR | Status: AC
  Filled 2020-08-23: qty 10

## 2020-08-23 MED ORDER — ATENOLOL 50 MG PO TABS
50.0000 mg | ORAL_TABLET | Freq: Every day | ORAL | Status: DC
  Administered 2020-08-24: 50 mg via ORAL
  Filled 2020-08-23: qty 1

## 2020-08-23 MED ORDER — ORAL CARE MOUTH RINSE: 15.0000 mL | Freq: Once | OROMUCOSAL | Status: AC

## 2020-08-23 MED ORDER — VANCOMYCIN HCL IN DEXTROSE 1-5 GM/200ML-% IV SOLN: 1000.0000 mg | Freq: Once | INTRAVENOUS | Status: DC

## 2020-08-23 MED ORDER — CHLORHEXIDINE GLUCONATE 0.12 % MT SOLN
15.0000 mL | Freq: Once | OROMUCOSAL | Status: AC
  Administered 2020-08-23: 15 mL via OROMUCOSAL

## 2020-08-23 MED ORDER — ASPIRIN EC 325 MG PO TBEC
325.0000 mg | DELAYED_RELEASE_TABLET | Freq: Two times a day (BID) | ORAL | Status: DC
  Administered 2020-08-24: 325 mg via ORAL
  Filled 2020-08-23: qty 1

## 2020-08-23 MED ORDER — ONDANSETRON HCL 4 MG PO TABS: 4.0000 mg | ORAL_TABLET | Freq: Four times a day (QID) | ORAL | Status: DC | PRN

## 2020-08-23 MED ORDER — CELECOXIB 200 MG PO CAPS
200.0000 mg | ORAL_CAPSULE | Freq: Once | ORAL | Status: AC
  Administered 2020-08-23: 200 mg via ORAL
  Filled 2020-08-23: qty 1

## 2020-08-23 MED ORDER — METOCLOPRAMIDE HCL 5 MG/ML IJ SOLN: 5.0000 mg | Freq: Three times a day (TID) | INTRAMUSCULAR | Status: DC | PRN

## 2020-08-23 MED ORDER — GABAPENTIN 300 MG PO CAPS
300.0000 mg | ORAL_CAPSULE | Freq: Three times a day (TID) | ORAL | Status: DC
  Administered 2020-08-23 – 2020-08-24 (×3): 300 mg via ORAL
  Filled 2020-08-23 (×3): qty 1

## 2020-08-23 MED ORDER — MORPHINE SULFATE (PF) 2 MG/ML IV SOLN: 0.5000 mg | INTRAVENOUS | Status: DC | PRN

## 2020-08-23 MED ORDER — DIPHENHYDRAMINE HCL 12.5 MG/5ML PO ELIX: 12.5000 mg | ORAL_SOLUTION | ORAL | Status: DC | PRN

## 2020-08-23 MED ORDER — LACTATED RINGERS IV SOLN: INTRAVENOUS | Status: DC

## 2020-08-23 MED ORDER — MENTHOL 3 MG MT LOZG: 1.0000 | LOZENGE | OROMUCOSAL | Status: DC | PRN

## 2020-08-23 MED ORDER — BRIMONIDINE TARTRATE 0.2 % OP SOLN
1.0000 [drp] | Freq: Two times a day (BID) | OPHTHALMIC | Status: DC
  Administered 2020-08-23 – 2020-08-24 (×2): 1 [drp] via OPHTHALMIC
  Filled 2020-08-23: qty 5

## 2020-08-23 MED ORDER — ONDANSETRON HCL 4 MG/2ML IJ SOLN: 4.0000 mg | Freq: Four times a day (QID) | INTRAMUSCULAR | Status: DC | PRN

## 2020-08-23 MED ORDER — PHENYLEPHRINE HCL-NACL 20-0.9 MG/250ML-% IV SOLN
INTRAVENOUS | Status: AC
  Filled 2020-08-23: qty 250

## 2020-08-23 MED ORDER — POLYETHYLENE GLYCOL 3350 17 G PO PACK: 17.0000 g | PACK | Freq: Every day | ORAL | Status: DC | PRN

## 2020-08-23 MED ORDER — DOCUSATE SODIUM 100 MG PO CAPS
100.0000 mg | ORAL_CAPSULE | Freq: Two times a day (BID) | ORAL | Status: DC
  Administered 2020-08-23 – 2020-08-24 (×2): 100 mg via ORAL
  Filled 2020-08-23 (×2): qty 1

## 2020-08-23 MED ORDER — METHOCARBAMOL 500 MG PO TABS: 500.0000 mg | ORAL_TABLET | Freq: Four times a day (QID) | ORAL | Status: DC | PRN

## 2020-08-23 MED ORDER — PHENYLEPHRINE HCL-NACL 20-0.9 MG/250ML-% IV SOLN
INTRAVENOUS | Status: DC | PRN
  Administered 2020-08-23: 20 ug/min via INTRAVENOUS

## 2020-08-23 MED ORDER — VANCOMYCIN HCL IN DEXTROSE 1-5 GM/200ML-% IV SOLN
1000.0000 mg | Freq: Two times a day (BID) | INTRAVENOUS | Status: AC
Start: 2020-08-23 — End: 2020-08-23
  Administered 2020-08-23: 1000 mg via INTRAVENOUS
  Filled 2020-08-23: qty 200

## 2020-08-23 MED ORDER — DEXAMETHASONE SODIUM PHOSPHATE 10 MG/ML IJ SOLN
8.0000 mg | Freq: Once | INTRAMUSCULAR | Status: AC
  Administered 2020-08-23: 4 mg via INTRAVENOUS

## 2020-08-23 MED ORDER — DEXAMETHASONE SODIUM PHOSPHATE 10 MG/ML IJ SOLN
10.0000 mg | Freq: Once | INTRAMUSCULAR | Status: AC
  Administered 2020-08-24: 10 mg via INTRAVENOUS
  Filled 2020-08-23: qty 1

## 2020-08-23 MED ORDER — DEXAMETHASONE SODIUM PHOSPHATE 10 MG/ML IJ SOLN
INTRAMUSCULAR | Status: DC | PRN
  Administered 2020-08-23: 6 mg

## 2020-08-23 MED ORDER — ACETAMINOPHEN 500 MG PO TABS
1000.0000 mg | ORAL_TABLET | Freq: Four times a day (QID) | ORAL | Status: AC
  Administered 2020-08-23 – 2020-08-24 (×4): 1000 mg via ORAL
  Filled 2020-08-23 (×4): qty 2

## 2020-08-23 MED ORDER — SODIUM CHLORIDE 0.9 % IR SOLN
Status: DC | PRN
  Administered 2020-08-23: 1000 mL

## 2020-08-23 MED ORDER — POVIDONE-IODINE 10 % EX SWAB
2.0000 "application " | Freq: Once | CUTANEOUS | Status: AC
  Administered 2020-08-23: 2 via TOPICAL

## 2020-08-23 MED ORDER — STERILE WATER FOR IRRIGATION IR SOLN
Status: DC | PRN
  Administered 2020-08-23: 2000 mL

## 2020-08-23 MED ORDER — FENTANYL CITRATE (PF) 100 MCG/2ML IJ SOLN
50.0000 ug | INTRAMUSCULAR | Status: DC
  Administered 2020-08-23: 100 ug via INTRAVENOUS
  Filled 2020-08-23: qty 2

## 2020-08-23 MED ORDER — PROPOFOL 10 MG/ML IV BOLUS
INTRAVENOUS | Status: AC
  Filled 2020-08-23: qty 20

## 2020-08-23 MED ORDER — PHENOL 1.4 % MT LIQD: 1.0000 | OROMUCOSAL | Status: DC | PRN

## 2020-08-23 MED ORDER — BISACODYL 10 MG RE SUPP: 10.0000 mg | Freq: Every day | RECTAL | Status: DC | PRN

## 2020-08-23 MED ORDER — PROMETHAZINE HCL 25 MG/ML IJ SOLN: 6.2500 mg | INTRAMUSCULAR | Status: DC | PRN

## 2020-08-23 MED ORDER — CEFAZOLIN SODIUM-DEXTROSE 2-4 GM/100ML-% IV SOLN
2.0000 g | Freq: Once | INTRAVENOUS | Status: AC
  Administered 2020-08-23: 2 g via INTRAVENOUS
  Filled 2020-08-23: qty 100

## 2020-08-23 MED ORDER — FLEET ENEMA 7-19 GM/118ML RE ENEM
1.0000 | ENEMA | Freq: Once | RECTAL | Status: DC | PRN
Start: 2020-08-23 — End: 2020-08-24

## 2020-08-23 MED ORDER — FENTANYL CITRATE (PF) 100 MCG/2ML IJ SOLN: 25.0000 ug | INTRAMUSCULAR | Status: DC | PRN

## 2020-08-23 MED ORDER — ACETAMINOPHEN 10 MG/ML IV SOLN
1000.0000 mg | Freq: Four times a day (QID) | INTRAVENOUS | Status: DC
  Administered 2020-08-23: 1000 mg via INTRAVENOUS
  Filled 2020-08-23: qty 100

## 2020-08-23 MED ORDER — METOCLOPRAMIDE HCL 5 MG PO TABS
5.0000 mg | ORAL_TABLET | Freq: Three times a day (TID) | ORAL | Status: DC | PRN
Start: 2020-08-23 — End: 2020-08-24

## 2020-08-23 MED ORDER — BUPIVACAINE LIPOSOME 1.3 % IJ SUSP
INTRAMUSCULAR | Status: DC | PRN
  Administered 2020-08-23: 20 mL

## 2020-08-23 MED ORDER — SODIUM CHLORIDE (PF) 0.9 % IJ SOLN
INTRAMUSCULAR | Status: DC | PRN
  Administered 2020-08-23: 60 mL

## 2020-08-23 MED ORDER — ACETAMINOPHEN 500 MG PO TABS: 1000.0000 mg | ORAL_TABLET | Freq: Once | ORAL | Status: DC

## 2020-08-23 MED ORDER — PROPOFOL 10 MG/ML IV BOLUS
INTRAVENOUS | Status: DC | PRN
  Administered 2020-08-23: 10 mg via INTRAVENOUS

## 2020-08-23 MED ORDER — BUPIVACAINE HCL (PF) 0.5 % IJ SOLN
INTRAMUSCULAR | Status: DC | PRN
  Administered 2020-08-23: 25 mL via PERINEURAL

## 2020-08-23 SURGICAL SUPPLY — 56 items
ATTUNE MED DOME PAT 38 KNEE (Knees) ×1 IMPLANT
ATTUNE PS FEM LT SZ 6 CEM KNEE (Femur) ×1 IMPLANT
ATTUNE PSRP INSR SZ6 10 KNEE (Insert) ×1 IMPLANT
BAG COUNTER SPONGE SURGICOUNT (BAG) ×1 IMPLANT
BAG SPEC THK2 15X12 ZIP CLS (MISCELLANEOUS)
BAG SPNG CNTER NS LX DISP (BAG) ×1
BAG ZIPLOCK 12X15 (MISCELLANEOUS) ×1 IMPLANT
BASE TIBIAL ROT PLAT SZ 5 KNEE (Knees) IMPLANT
BLADE SAG 18X100X1.27 (BLADE) ×2 IMPLANT
BLADE SAW SGTL 11.0X1.19X90.0M (BLADE) ×2 IMPLANT
BNDG ELASTIC 6X5.8 VLCR STR LF (GAUZE/BANDAGES/DRESSINGS) ×2 IMPLANT
BOWL SMART MIX CTS (DISPOSABLE) ×2 IMPLANT
BSPLAT TIB 5 CMNT ROT PLAT STR (Knees) ×1 IMPLANT
CEMENT HV SMART SET (Cement) ×4 IMPLANT
COVER SURGICAL LIGHT HANDLE (MISCELLANEOUS) ×2 IMPLANT
CUFF TOURN SGL QUICK 34 (TOURNIQUET CUFF) ×2
CUFF TRNQT CYL 34X4.125X (TOURNIQUET CUFF) ×1 IMPLANT
DECANTER SPIKE VIAL GLASS SM (MISCELLANEOUS) ×2 IMPLANT
DRAPE U-SHAPE 47X51 STRL (DRAPES) ×2 IMPLANT
DRSG AQUACEL AG ADV 3.5X10 (GAUZE/BANDAGES/DRESSINGS) ×2 IMPLANT
DURAPREP 26ML APPLICATOR (WOUND CARE) ×2 IMPLANT
ELECT REM PT RETURN 15FT ADLT (MISCELLANEOUS) ×2 IMPLANT
GLOVE SRG 8 PF TXTR STRL LF DI (GLOVE) ×1 IMPLANT
GLOVE SURG ENC MOIS LTX SZ6.5 (GLOVE) ×2 IMPLANT
GLOVE SURG ENC MOIS LTX SZ8 (GLOVE) ×4 IMPLANT
GLOVE SURG UNDER POLY LF SZ7 (GLOVE) ×2 IMPLANT
GLOVE SURG UNDER POLY LF SZ8 (GLOVE) ×2
GLOVE SURG UNDER POLY LF SZ8.5 (GLOVE) ×2 IMPLANT
GOWN STRL REUS W/TWL LRG LVL3 (GOWN DISPOSABLE) ×4 IMPLANT
GOWN STRL REUS W/TWL XL LVL3 (GOWN DISPOSABLE) ×2 IMPLANT
HANDPIECE INTERPULSE COAX TIP (DISPOSABLE) ×2
HOLDER FOLEY CATH W/STRAP (MISCELLANEOUS) ×1 IMPLANT
IMMOBILIZER KNEE 20 (SOFTGOODS) ×2
IMMOBILIZER KNEE 20 THIGH 36 (SOFTGOODS) ×1 IMPLANT
KIT TURNOVER KIT A (KITS) ×2 IMPLANT
MANIFOLD NEPTUNE II (INSTRUMENTS) ×2 IMPLANT
NS IRRIG 1000ML POUR BTL (IV SOLUTION) ×2 IMPLANT
PACK TOTAL KNEE CUSTOM (KITS) ×2 IMPLANT
PADDING CAST COTTON 6X4 STRL (CAST SUPPLIES) ×3 IMPLANT
PENCIL SMOKE EVACUATOR (MISCELLANEOUS) ×1 IMPLANT
PIN DRILL FIX HALF THREAD (BIT) ×1 IMPLANT
PIN STEINMAN FIXATION KNEE (PIN) ×1 IMPLANT
PROTECTOR NERVE ULNAR (MISCELLANEOUS) ×2 IMPLANT
SET HNDPC FAN SPRY TIP SCT (DISPOSABLE) ×1 IMPLANT
SPONGE T-LAP 18X18 ~~LOC~~+RFID (SPONGE) ×2 IMPLANT
STRIP CLOSURE SKIN 1/2X4 (GAUZE/BANDAGES/DRESSINGS) ×4 IMPLANT
SUT MNCRL AB 4-0 PS2 18 (SUTURE) ×2 IMPLANT
SUT STRATAFIX 0 PDS 27 VIOLET (SUTURE) ×2
SUT VIC AB 2-0 CT1 27 (SUTURE) ×6
SUT VIC AB 2-0 CT1 TAPERPNT 27 (SUTURE) ×3 IMPLANT
SUTURE STRATFX 0 PDS 27 VIOLET (SUTURE) ×1 IMPLANT
TIBIAL BASE ROT PLAT SZ 5 KNEE (Knees) ×2 IMPLANT
TRAY FOLEY MTR SLVR 14FR STAT (SET/KITS/TRAYS/PACK) ×1 IMPLANT
TUBE SUCTION HIGH CAP CLEAR NV (SUCTIONS) ×2 IMPLANT
WATER STERILE IRR 1000ML POUR (IV SOLUTION) ×4 IMPLANT
WRAP KNEE MAXI GEL POST OP (GAUZE/BANDAGES/DRESSINGS) ×2 IMPLANT

## 2020-08-23 NOTE — Anesthesia Procedure Notes (Signed)
Anesthesia Regional Block: Adductor canal block   Pre-Anesthetic Checklist: , timeout performed,  Correct Patient, Correct Site, Correct Laterality,  Correct Procedure, Correct Position, site marked,  Risks and benefits discussed,  Surgical consent,  Pre-op evaluation,  At surgeon's request and post-op pain management  Laterality: Left  Prep: chloraprep       Needles:  Injection technique: Single-shot  Needle Type: Stimulator Needle - 80     Needle Length: 10cm  Needle Gauge: 21     Additional Needles:   Narrative:  Start time: 08/23/2020 7:22 AM End time: 08/23/2020 7:32 AM Injection made incrementally with aspirations every 5 mL.  Performed by: Personally  Anesthesiologist: Duane Boston, MD

## 2020-08-23 NOTE — Op Note (Signed)
OPERATIVE REPORT-TOTAL KNEE ARTHROPLASTY   Pre-operative diagnosis- Osteoarthritis  Left knee(s)  Post-operative diagnosis- Osteoarthritis Left knee(s)  Procedure-  Left  Total Knee Arthroplasty  Surgeon- Dione Plover. Maryama Kuriakose, MD  Assistant- Theresa Duty, PA-C   Anesthesia-   Adductor canal block and spinal  EBL-300 mL   Drains None  Tourniquet time-  Total Tourniquet Time Documented: Thigh (Left) - 2 minutes Total: Thigh (Left) - 2 minutes     Complications- None  Condition-PACU - hemodynamically stable.   Brief Clinical Note  Amy Newman is a 70 y.o. year old female with end stage OA of her left knee with progressively worsening pain and dysfunction. She has constant pain, with activity and at rest and significant functional deficits with difficulties even with ADLs. She has had extensive non-op management including analgesics, injections of cortisone and viscosupplements, and home exercise program, but remains in significant pain with significant dysfunction. Radiographs show bone on bone arthritis medial and patellofemoral. She presents now for left Total Knee Arthroplasty.     Procedure in detail---   The patient is brought into the operating room and positioned supine on the operating table. After successful administration of  Adductor canal block and spinal,   a tourniquet is placed high on the  Left thigh(s) and the lower extremity is prepped and draped in the usual sterile fashion. Time out is performed by the operating team and then the  Left lower extremity is wrapped in Esmarch, knee flexed and the tourniquet inflated to 300 mmHg.       A midline incision is made with a ten blade through the subcutaneous tissue to the level of the extensor mechanism. A fresh blade is used to make a medial parapatellar arthrotomy. Soft tissue over the proximal medial tibia is subperiosteally elevated to the joint line with a knife and into the semimembranosus bursa with a Cobb  elevator. Soft tissue over the proximal lateral tibia is elevated with attention being paid to avoiding the patellar tendon on the tibial tubercle. The patella is everted, knee flexed 90 degrees and the ACL and PCL are removed. Findings are bone on bone medial and patellofemoral with massive global osteophytes. The tourniquet is not working and is released after one minute        The drill is used to create a starting hole in the distal femur and the canal is thoroughly irrigated with sterile saline to remove the fatty contents. The 5 degree Left  valgus alignment guide is placed into the femoral canal and the distal femoral cutting block is pinned to remove 10 mm off the distal femur. Resection is made with an oscillating saw.      The tibia is subluxed forward and the menisci are removed. The extramedullary alignment guide is placed referencing proximally at the medial aspect of the tibial tubercle and distally along the second metatarsal axis and tibial crest. The block is pinned to remove 32m off the more deficient medial  side. Resection is made with an oscillating saw. Size 5is the most appropriate size for the tibia and the proximal tibia is prepared with the modular drill and keel punch for that size.      The femoral sizing guide is placed and size 6 is most appropriate. Rotation is marked off the epicondylar axis and confirmed by creating a rectangular flexion gap at 90 degrees. The size 6 cutting block is pinned in this rotation and the anterior, posterior and chamfer cuts are made with the oscillating  saw. The intercondylar block is then placed and that cut is made.      Trial size 5 tibial component, trial size 6 posterior stabilized femur and a 10  mm posterior stabilized rotating platform insert trial is placed. Full extension is achieved with excellent varus/valgus and anterior/posterior balance throughout full range of motion. The patella is everted and thickness measured to be 22  mm. Free hand  resection is taken to 12 mm, a 38 template is placed, lug holes are drilled, trial patella is placed, and it tracks normally. Osteophytes are removed off the posterior femur with the trial in place. All trials are removed and the cut bone surfaces prepared with pulsatile lavage. Cement is mixed and once ready for implantation, the size 5 tibial implant, size  6 posterior stabilized femoral component, and the size 38 patella are cemented in place and the patella is held with the clamp. The trial insert is placed and the knee held in full extension. The Exparel (20 ml mixed with 60 ml saline) is injected into the extensor mechanism, posterior capsule, medial and lateral gutters and subcutaneous tissues.  All extruded cement is removed and once the cement is hard the permanent 10 mm posterior stabilized rotating platform insert is placed into the tibial tray.      The wound is copiously irrigated with saline solution and the extensor mechanism closed with # 0 Stratofix suture. Flexion against gravity is 100 degrees and the patella tracks normally. Subcutaneous tissue is closed with 2.0 vicryl and subcuticular with running 4.0 Monocryl. The incision is cleaned and dried and steri-strips and a bulky sterile dressing are applied. The limb is placed into a knee immobilizer and the patient is awakened and transported to recovery in stable condition.      Please note that a surgical assistant was a medical necessity for this procedure in order to perform it in a safe and expeditious manner. Surgical assistant was necessary to retract the ligaments and vital neurovascular structures to prevent injury to them and also necessary for proper positioning of the limb to allow for anatomic placement of the prosthesis.   Dione Plover Kenyetta Fife, MD    08/23/2020, 9:31 AM

## 2020-08-23 NOTE — Progress Notes (Signed)
IV infiltrated and patient refused new IV site. Patient is drinking plenty of water.

## 2020-08-23 NOTE — Evaluation (Signed)
Physical Therapy Evaluation Patient Details Name: Amy Newman MRN: [id number] DOB: 02/06/1950 Today's Date: 08/23/2020   History of Present Illness  s/p L TKA PMH: obesity, HTN, Right RCR  Clinical Impression  Pt is s/p TKA resulting in the deficits listed below (see PT Problem List).  Pt amb 15' with min assist, mobilizing well but limited by fatigue. Anticipate steady progress.   Pt will benefit from skilled PT to increase their independence and safety with mobility to allow discharge to the venue listed below.      Follow Up Recommendations Follow surgeon's recommendation for DC plan and follow-up therapies    Equipment Recommendations  Rolling walker with 5" wheels;3in1 (PT)    Recommendations for Other Services       Precautions / Restrictions Precautions Precautions: Fall;Knee Required Braces or Orthoses: Knee Immobilizer - Left Knee Immobilizer - Left: Discontinue once straight leg raise with < 10 degree lag Restrictions Weight Bearing Restrictions: No Other Position/Activity Restrictions: WBAT      Mobility  Bed Mobility Overal bed mobility: Needs Assistance Bed Mobility: Supine to Sit     Supine to sit: Min guard     General bed mobility comments: incr time and effort, cues to self assist    Transfers Overall transfer level: Needs assistance Equipment used: Rolling walker (2 wheeled) Transfers: Sit to/from Stand Sit to Stand: Min assist         General transfer comment: 3 attempts to come to stand, min assist to rise and transition to RW. cues for hand placement  Ambulation/Gait Ambulation/Gait assistance: Min assist Gait Distance (Feet): 15 Feet Assistive device: Rolling walker (2 wheeled) Gait Pattern/deviations: Step-to pattern;Decreased stance time - left     General Gait Details: cues for sequence, trunk extension and RW position  Stairs            Wheelchair Mobility    Modified Rankin (Stroke Patients Only)        Balance                                             Pertinent Vitals/Pain Pain Assessment: 0-10 Pain Score: 0-No pain Pain Location: L knee Pain Intervention(s): Limited activity within patient's tolerance;Monitored during session;Repositioned    Home Living Family/patient expects to be discharged to:: Private residence Living Arrangements: Alone Available Help at Discharge: Family Type of Home: House Home Access: Stairs to enter Entrance Stairs-Rails: Right;Left;Can reach both Technical brewer of Steps: 6 Home Layout: One level Home Equipment: Crutches      Prior Function Level of Independence: Independent               Hand Dominance        Extremity/Trunk Assessment   Upper Extremity Assessment Upper Extremity Assessment: Overall WFL for tasks assessed    Lower Extremity Assessment Lower Extremity Assessment: LLE deficits/detail LLE Deficits / Details: ankle WLF, knee extension and hip flexion grossly 2+/5       Communication   Communication: No difficulties  Cognition Arousal/Alertness: Awake/alert Behavior During Therapy: WFL for tasks assessed/performed Overall Cognitive Status: Within Functional Limits for tasks assessed                                        General Comments  Exercises Total Joint Exercises Ankle Circles/Pumps: AROM;Left;5 reps Quad Sets: AROM;5 reps;Both   Assessment/Plan    PT Assessment Patient needs continued PT services  PT Problem List Decreased strength;Decreased activity tolerance;Decreased mobility;Pain;Decreased knowledge of use of DME;Decreased range of motion       PT Treatment Interventions DME instruction;Gait training;Functional mobility training;Therapeutic exercise;Therapeutic activities;Patient/family education;Stair training    PT Goals (Current goals can be found in the Care Plan section)  Acute Rehab PT Goals Patient Stated Goal: walk better PT Goal  Formulation: With patient Time For Goal Achievement: 08/30/20    Frequency 7X/week   Barriers to discharge        Co-evaluation               AM-PAC PT "6 Clicks" Mobility  Outcome Measure Help needed turning from your back to your side while in a flat bed without using bedrails?: A Little Help needed moving from lying on your back to sitting on the side of a flat bed without using bedrails?: A Little Help needed moving to and from a bed to a chair (including a wheelchair)?: A Little Help needed standing up from a chair using your arms (e.g., wheelchair or bedside chair)?: A Little Help needed to walk in hospital room?: A Little Help needed climbing 3-5 steps with a railing? : A Lot 6 Click Score: 17    End of Session Equipment Utilized During Treatment: Gait belt Activity Tolerance: Patient tolerated treatment well Patient left: with call bell/phone within reach;in chair;with chair alarm set Nurse Communication: Mobility status PT Visit Diagnosis: Other abnormalities of gait and mobility (R26.89);Difficulty in walking, not elsewhere classified (R26.2)    Time: [id number] PT Time Calculation (min) (ACUTE ONLY): 20 min   Charges:   PT Evaluation $PT Eval Low Complexity: Gordon, PT  Acute Rehab Dept ([place]) [phone] Pager [phone]  08/23/2020   [facility] 08/23/2020, 5:00 PM

## 2020-08-23 NOTE — Anesthesia Procedure Notes (Signed)
Procedure Name: MAC Date/Time: 08/23/2020 8:06 AM Performed by: West Pugh, CRNA Pre-anesthesia Checklist: Patient identified, Emergency Drugs available, Suction available, Patient being monitored and Timeout performed Patient Re-evaluated:Patient Re-evaluated prior to induction Oxygen Delivery Method: Simple face mask Preoxygenation: Pre-oxygenation with 100% oxygen Induction Type: IV induction Number of attempts: 1 Placement Confirmation: positive ETCO2 Dental Injury: Teeth and Oropharynx as per pre-operative assessment

## 2020-08-23 NOTE — Discharge Instructions (Signed)
 Frank Aluisio, MD Total Joint Specialist [facility] [place] [address] [place], [place] [address] [phone]  TOTAL KNEE REPLACEMENT POSTOPERATIVE DIRECTIONS    Knee Rehabilitation, Guidelines Following Surgery  Results after knee surgery are often greatly improved when you follow the exercise, range of motion and muscle strengthening exercises prescribed by your doctor. Safety measures are also important to protect the knee from further injury. If any of these exercises cause you to have increased pain or swelling in your knee joint, decrease the amount until you are comfortable again and slowly increase them. If you have problems or questions, call your caregiver or physical therapist for advice.    BLOOD CLOT PREVENTION Take a 10 mg Xarelto once a day for three weeks following surgery. Then take an 81 mg Aspirin once a day for three weeks. Then discontinue Aspirin. You may resume your vitamins/supplements once you have discontinued the Xarelto. Do not take any NSAIDs (Advil, Aleve, Ibuprofen, Meloxicam, etc.) until you have discontinued the Xarelto.    HOME CARE INSTRUCTIONS  Remove items at home which could result in a fall. This includes throw rugs or furniture in walking pathways.  ICE to the affected knee as much as tolerated. Icing helps control swelling. If the swelling is well controlled you will be more comfortable and rehab easier. Continue to use ice on the knee for pain and swelling from surgery. You may notice swelling that will progress down to the foot and ankle. This is normal after surgery. Elevate the leg when you are not up walking on it.    Continue to use the breathing machine which will help keep your temperature down. It is common for your temperature to cycle up and down following surgery, especially at night when you are not up moving around and exerting yourself. The breathing machine keeps your lungs expanded and your temperature  down. Do not place pillow under the operative knee, focus on keeping the knee straight while resting  DIET You may resume your previous home diet once you are discharged from the hospital.  DRESSING / WOUND CARE / SHOWERING Keep your bulky bandage on for 2 days. On the third post-operative day you may remove the Ace bandage and gauze. There is a waterproof adhesive bandage on your skin which will stay in place until your first follow-up appointment. Once you remove this you will not need to place another bandage You may begin showering 3 days following surgery, but do not submerge the incision under water.  ACTIVITY For the first 5 days, the key is rest and control of pain and swelling Do your home exercises twice a day starting on post-operative day 3. On the days you go to physical therapy, just do the home exercises once that day. You should rest, ice and elevate the leg for 50 minutes out of every hour. Get up and walk/stretch for 10 minutes per hour. After 5 days you can increase your activity slowly as tolerated. Walk with your walker as instructed. Use the walker until you are comfortable transitioning to a cane. Walk with the cane in the opposite hand of the operative leg. You may discontinue the cane once you are comfortable and walking steadily. Avoid periods of inactivity such as sitting longer than an hour when not asleep. This helps prevent blood clots.  You may discontinue the knee immobilizer once you are able to perform a straight leg raise while lying down. You may resume a sexual relationship in one   month or when given the OK by your doctor.  You may return to work once you are cleared by your doctor.  Do not drive a car for 6 weeks or until released by your surgeon.  Do not drive while taking narcotics.  TED HOSE STOCKINGS Wear the elastic stockings on both legs for three weeks following surgery during the day. You may remove them at night for sleeping.  WEIGHT  BEARING Weight bearing as tolerated with assist device (walker, cane, etc) as directed, use it as long as suggested by your surgeon or therapist, typically at least 4-6 weeks.  POSTOPERATIVE CONSTIPATION PROTOCOL Constipation - defined medically as fewer than three stools per week and severe constipation as less than one stool per week.  One of the most common issues patients have following surgery is constipation.  Even if you have a regular bowel pattern at home, your normal regimen is likely to be disrupted due to multiple reasons following surgery.  Combination of anesthesia, postoperative narcotics, change in appetite and fluid intake all can affect your bowels.  In order to avoid complications following surgery, here are some recommendations in order to help you during your recovery period.  Colace (docusate) - Pick up an over-the-counter form of Colace or another stool softener and take twice a day as long as you are requiring postoperative pain medications.  Take with a full glass of water daily.  If you experience loose stools or diarrhea, hold the colace until you stool forms back up. If your symptoms do not get better within 1 week or if they get worse, check with your doctor. Dulcolax (bisacodyl) - Pick up over-the-counter and take as directed by the product packaging as needed to assist with the movement of your bowels.  Take with a full glass of water.  Use this product as needed if not relieved by Colace only.  MiraLax (polyethylene glycol) - Pick up over-the-counter to have on hand. MiraLax is a solution that will increase the amount of water in your bowels to assist with bowel movements.  Take as directed and can mix with a glass of water, juice, soda, coffee, or tea. Take if you go more than two days without a movement. Do not use MiraLax more than once per day. Call your doctor if you are still constipated or irregular after using this medication for 7 days in a row.  If you continue  to have problems with postoperative constipation, please contact the office for further assistance and recommendations.  If you experience "the worst abdominal pain ever" or develop nausea or vomiting, please contact the office immediatly for further recommendations for treatment.  ITCHING If you experience itching with your medications, try taking only a single pain pill, or even half a pain pill at a time.  You can also use Benadryl over the counter for itching or also to help with sleep.   MEDICATIONS See your medication summary on the "After Visit Summary" that the nursing staff will review with you prior to discharge.  You may have some home medications which will be placed on hold until you complete the course of blood thinner medication.  It is important for you to complete the blood thinner medication as prescribed by your surgeon.  Continue your approved medications as instructed at time of discharge.  PRECAUTIONS If you experience chest pain or shortness of breath - call 911 immediately for transfer to the hospital emergency department.  If you develop a fever greater that 101   F, purulent drainage from wound, increased redness or drainage from wound, foul odor from the wound/dressing, or calf pain - CONTACT YOUR SURGEON.                                                   FOLLOW-UP APPOINTMENTS Make sure you keep all of your appointments after your operation with your surgeon and caregivers. You should call the office at the above phone number and make an appointment for approximately two weeks after the date of your surgery or on the date instructed by your surgeon outlined in the "After Visit Summary".  RANGE OF MOTION AND STRENGTHENING EXERCISES  Rehabilitation of the knee is important following a knee injury or an operation. After just a few days of immobilization, the muscles of the thigh which control the knee become weakened and shrink (atrophy). Knee exercises are designed to build up  the tone and strength of the thigh muscles and to improve knee motion. Often times heat used for twenty to thirty minutes before working out will loosen up your tissues and help with improving the range of motion but do not use heat for the first two weeks following surgery. These exercises can be done on a training (exercise) mat, on the floor, on a table or on a bed. Use what ever works the best and is most comfortable for you Knee exercises include:  Leg Lifts - While your knee is still immobilized in a splint or cast, you can do straight leg raises. Lift the leg to 60 degrees, hold for 3 sec, and slowly lower the leg. Repeat 10-20 times 2-3 times daily. Perform this exercise against resistance later as your knee gets better.  Quad and Hamstring Sets - Tighten up the muscle on the front of the thigh (Quad) and hold for 5-10 sec. Repeat this 10-20 times hourly. Hamstring sets are done by pushing the foot backward against an object and holding for 5-10 sec. Repeat as with quad sets.  Leg Slides: Lying on your back, slowly slide your foot toward your buttocks, bending your knee up off the floor (only go as far as is comfortable). Then slowly slide your foot back down until your leg is flat on the floor again. Angel Wings: Lying on your back spread your legs to the side as far apart as you can without causing discomfort.  A rehabilitation program following serious knee injuries can speed recovery and prevent re-injury in the future due to weakened muscles. Contact your doctor or a physical therapist for more information on knee rehabilitation.   POST-OPERATIVE OPIOID TAPER INSTRUCTIONS: It is important to wean off of your opioid medication as soon as possible. If you do not need pain medication after your surgery it is ok to stop day one. Opioids include: Codeine, Hydrocodone(Norco, Vicodin), Oxycodone(Percocet, oxycontin) and hydromorphone amongst others.  Long term and even short term use of opiods can  cause: Increased pain response Dependence Constipation Depression Respiratory depression And more.  Withdrawal symptoms can include Flu like symptoms Nausea, vomiting And more Techniques to manage these symptoms Hydrate well Eat regular healthy meals Stay active Use relaxation techniques(deep breathing, meditating, yoga) Do Not substitute Alcohol to help with tapering If you have been on opioids for less than two weeks and do not have pain than it is ok to stop all together.    plan should start within one week post op of your joint replacement. Maintain the same interval or time between taking each dose and first decrease the dose.  Cut the total daily intake of opioids by one tablet each day Next start to increase the time between doses. The last dose that should be eliminated is the evening dose.   IF YOU ARE TRANSFERRED TO A SKILLED REHAB FACILITY If the patient is transferred to a skilled rehab facility following release from the hospital, a list of the current medications will be sent to the facility for the patient to continue.  When discharged from the skilled rehab facility, please have the facility set up the patient's Home Health Physical Therapy prior to being released. Also, the skilled facility will be responsible for providing the patient with their medications at time of release from the facility to include their pain medication, the muscle relaxants, and their blood thinner medication. If the patient is still at the rehab facility at time of the two week follow up appointment, the skilled rehab facility will also need to assist the patient in arranging follow up appointment in our office and any transportation needs.  MAKE SURE YOU:  Understand these instructions.  Get help right away if you are not doing well or get worse.   DENTAL ANTIBIOTICS:  In most cases prophylactic antibiotics for Dental procdeures after total joint surgery are not  necessary.  Exceptions are as follows:  1. History of prior total joint infection  2. Severely immunocompromised (Organ Transplant, cancer chemotherapy, Rheumatoid biologic meds such as Humera)  3. Poorly controlled diabetes (A1C &gt; 8.0, blood glucose over 200)  If you have one of these conditions, contact your surgeon for an antibiotic prescription, prior to your dental procedure.    Pick up stool softner and laxative for home use following surgery while on pain medications. Do not submerge incision under water. Please use good hand washing techniques while changing dressing each day. May shower starting three days after surgery. Please use a clean towel to pat the incision dry following showers. Continue to use ice for pain and swelling after surgery. Do not use any lotions or creams on the incision until instructed by your surgeon.  

## 2020-08-23 NOTE — Interval H&P Note (Signed)
History and Physical Interval Note:  08/23/2020 6:31 AM  Amy Newman  has presented today for surgery, with the diagnosis of left knee osteoarthritis.  The various methods of treatment have been discussed with the patient and family. After consideration of risks, benefits and other options for treatment, the patient has consented to  Procedure(s): TOTAL KNEE ARTHROPLASTY (Left) as a surgical intervention.  The patient's history has been reviewed, patient examined, no change in status, stable for surgery.  I have reviewed the patient's chart and labs.  Questions were answered to the patient's satisfaction.     Pilar Plate Sharian Delia

## 2020-08-23 NOTE — Transfer of Care (Signed)
Immediate Anesthesia Transfer of Care Note  Patient: Amy Newman  Procedure(s) Performed: TOTAL KNEE ARTHROPLASTY (Left: Knee)  Patient Location: PACU  Anesthesia Type:Spinal and MAC combined with regional for post-op pain  Level of Consciousness: awake and drowsy  Airway & Oxygen Therapy: Patient Spontanous Breathing and Patient connected to face mask oxygen  Post-op Assessment: Report given to RN and Post -op Vital signs reviewed and stable  Post vital signs: Reviewed and stable  Last Vitals:  Vitals Value Taken Time  BP 149/75 08/23/20 0952  Temp    Pulse 70 08/23/20 0954  Resp    SpO2 100 % 08/23/20 0954  Vitals shown include unvalidated device data.  Last Pain:  Vitals:   08/23/20 0757  TempSrc:   PainSc: 0-No pain      Patients Stated Pain Goal: 4 (00/17/[birth year] 4496)  Complications: No notable events documented.

## 2020-08-23 NOTE — Progress Notes (Signed)
Orthopedic Tech Progress Note Patient Details:  Amy Newman [birth year] [id number]  CPM Left Knee CPM Left Knee: On Left Knee Flexion (Degrees): 40 Left Knee Extension (Degrees): 10  Post Interventions Patient Tolerated: Well Instructions Provided: Care of device  [name] 08/23/2020, 9:53 AM

## 2020-08-23 NOTE — Anesthesia Postprocedure Evaluation (Signed)
Anesthesia Post Note  Patient: Amy Newman  Procedure(s) Performed: TOTAL KNEE ARTHROPLASTY (Left: Knee)     Patient location during evaluation: PACU Anesthesia Type: MAC Level of consciousness: awake and alert Pain management: pain level controlled Vital Signs Assessment: post-procedure vital signs reviewed and stable Respiratory status: spontaneous breathing and respiratory function stable Cardiovascular status: blood pressure returned to baseline and stable Postop Assessment: spinal receding Anesthetic complications: no   No notable events documented.  Last Vitals:  Vitals:   08/23/20 1045 08/23/20 1101  BP: 138/69 (!) 154/81  Pulse: (!) 58 (!) 56  Resp: 16 15  Temp: (!) 36.3 C   SpO2: 97% 99%    Last Pain:  Vitals:   08/23/20 1045  TempSrc:   PainSc: 0-No pain                 Krystianna Soth DANIEL

## 2020-08-23 NOTE — Anesthesia Procedure Notes (Signed)
Spinal  Patient location during procedure: OR Start time: 08/23/2020 8:02 AM End time: 08/23/2020 8:13 AM Reason for block: surgical anesthesia Staffing Performed: anesthesiologist  Anesthesiologist: Duane Boston, MD Preanesthetic Checklist Completed: patient identified, IV checked, risks and benefits discussed, surgical consent, monitors and equipment checked, pre-op evaluation and timeout performed Spinal Block Patient position: sitting Prep: DuraPrep Patient monitoring: cardiac monitor, continuous pulse ox and blood pressure Approach: midline Location: L2-3 Injection technique: single-shot Needle Needle type: Pencan  Needle gauge: 24 G Needle length: 9 cm Assessment Events: CSF return Additional Notes Functioning IV was confirmed and monitors were applied. Sterile prep and drape, including hand hygiene and sterile gloves were used. The patient was positioned and the spine was prepped. The skin was anesthetized with lidocaine.  Free flow of clear CSF was obtained prior to injecting local anesthetic into the CSF.  The spinal needle aspirated freely following injection.  The needle was carefully withdrawn.  The patient tolerated the procedure well.

## 2020-08-23 NOTE — Care Plan (Signed)
Ortho Bundle Case Management Note  Patient Details  Name: VERINA SEABERRY MRN: [id number] Date of Birth: 1950-09-28                  L TKA on 08/23/20. DCP: Home with son. 1 story home with 6 steps. DME: RW ordered through [facility]. Has handicap toilets, doesn't need 3in1. PT: [facility] 8/25   DME Arranged:  [place] Rile rolling DME Agency:  [employer]  HH Arranged:    [place] Agency:     Additional Comments: Please contact me with any questions of if this plan should need to change.  Marianne Sofia, RN,CCM [facility]  [phone] 08/23/2020, 8:50 AM

## 2020-08-23 NOTE — Progress Notes (Signed)
Assisted Dr. Singer with left, ultrasound guided, adductor canal block. Side rails up, monitors on throughout procedure. See vital signs in flow sheet. Tolerated Procedure well.  

## 2020-08-24 ENCOUNTER — Encounter (HOSPITAL_COMMUNITY): Payer: Self-pay | Admitting: Orthopedic Surgery

## 2020-08-24 DIAGNOSIS — I10 Essential (primary) hypertension: Secondary | ICD-10-CM | POA: Diagnosis not present

## 2020-08-24 DIAGNOSIS — Z7982 Long term (current) use of aspirin: Secondary | ICD-10-CM | POA: Diagnosis not present

## 2020-08-24 DIAGNOSIS — Z79899 Other long term (current) drug therapy: Secondary | ICD-10-CM | POA: Diagnosis not present

## 2020-08-24 DIAGNOSIS — M1712 Unilateral primary osteoarthritis, left knee: Secondary | ICD-10-CM | POA: Diagnosis not present

## 2020-08-24 DIAGNOSIS — Z8541 Personal history of malignant neoplasm of cervix uteri: Secondary | ICD-10-CM | POA: Diagnosis not present

## 2020-08-24 LAB — BASIC METABOLIC PANEL
Anion gap: 9 (ref 5–15)
BUN: 20 mg/dL (ref 8–23)
CO2: 26 mmol/L (ref 22–32)
Calcium: 8.4 mg/dL — ABNORMAL LOW (ref 8.9–10.3)
Chloride: 105 mmol/L (ref 98–111)
Creatinine, Ser: 1.29 mg/dL — ABNORMAL HIGH (ref 0.44–1.00)
GFR, Estimated: 45 mL/min — ABNORMAL LOW (ref >60)
Glucose, Bld: 176 mg/dL — ABNORMAL HIGH (ref 70–99)
Potassium: 3.9 mmol/L (ref 3.5–5.1)
Sodium: 140 mmol/L (ref 135–145)

## 2020-08-24 LAB — CBC
HCT: 31.6 % — ABNORMAL LOW (ref 36.0–46.0)
Hemoglobin: 10.1 g/dL — ABNORMAL LOW (ref 12.0–15.0)
MCH: 30.9 pg (ref 26.0–34.0)
MCHC: 32 g/dL (ref 30.0–36.0)
MCV: 96.6 fL (ref 80.0–100.0)
Platelets: 193 10*3/uL (ref 150–400)
RBC: 3.27 MIL/uL — ABNORMAL LOW (ref 3.87–5.11)
RDW: 12.9 % (ref 11.5–15.5)
WBC: 10 10*3/uL (ref 4.0–10.5)
nRBC: 0 % (ref 0.0–0.2)

## 2020-08-24 MED ORDER — SODIUM CHLORIDE 0.9 % IV BOLUS
250.0000 mL | Freq: Once | INTRAVENOUS | Status: AC
  Administered 2020-08-24: 250 mL via INTRAVENOUS

## 2020-08-24 MED ORDER — METHOCARBAMOL 500 MG PO TABS: 500.0000 mg | ORAL_TABLET | Freq: Four times a day (QID) | ORAL | 0 refills | Status: DC | PRN

## 2020-08-24 MED ORDER — GABAPENTIN 300 MG PO CAPS: ORAL_CAPSULE | ORAL | 0 refills | Status: DC

## 2020-08-24 MED ORDER — ASPIRIN 325 MG PO TBEC: 325.0000 mg | DELAYED_RELEASE_TABLET | Freq: Two times a day (BID) | ORAL | 0 refills | Status: AC

## 2020-08-24 MED ORDER — OXYCODONE HCL 5 MG PO TABS: 5.0000 mg | ORAL_TABLET | Freq: Four times a day (QID) | ORAL | 0 refills | Status: DC | PRN

## 2020-08-24 NOTE — Plan of Care (Signed)
Discharge instructions given to the patient. °

## 2020-08-24 NOTE — Progress Notes (Signed)
   Subjective: 1 Day Post-Op Procedure(s) (LRB): TOTAL KNEE ARTHROPLASTY (Left) Patient reports pain as mild.   Patient seen in rounds by Dr. Wynelle Link. Patient is well, and has had no acute complaints or problems. Denies chest pain or SOB. Foley catheter removed this AM,  no issues overnight.  We will continue therapy today, ambulated 15' yesterday.  Objective: Vital signs in last 24 hours: Temp:  [96.5 F (35.8 C)-98.5 F (36.9 C)] 98 F (36.7 C) (08/23 0549) Pulse Rate:  [56-74] 70 (08/23 0549) Resp:  [10-17] 17 (08/23 0549) BP: (125-203)/(56-94) 125/56 (08/23 0549) SpO2:  [95 %-100 %] 97 % (08/23 0549)  Intake/Output from previous day:  Intake/Output Summary (Last 24 hours) at 08/24/2020 0712 Last data filed at 08/24/2020 0640 Gross per 24 hour  Intake 2944.05 ml  Output 3100 ml  Net -155.95 ml     Intake/Output this shift: No intake/output data recorded.  Labs: Recent Labs    08/24/20 0308  HGB 10.1*   Recent Labs    08/24/20 0308  WBC 10.0  RBC 3.27*  HCT 31.6*  PLT 193   Recent Labs    08/24/20 0307  NA 140  K 3.9  CL 105  CO2 26  BUN 20  CREATININE 1.29*  GLUCOSE 176*  CALCIUM 8.4*   No results for input(s): LABPT, INR in the last 72 hours.  Exam: General - Patient is Alert and Oriented Extremity - Neurologically intact Neurovascular intact Sensation intact distally Dorsiflexion/Plantar flexion intact Dressing - dressing C/D/I Motor Function - intact, moving foot and toes well on exam.   Past Medical History:  Diagnosis Date   Arthritis    Cancer ([place])    cervical cancer   H/O: cesarean section    Hypertension    Obese    PONV (postoperative nausea and vomiting)     Assessment/Plan: 1 Day Post-Op Procedure(s) (LRB): TOTAL KNEE ARTHROPLASTY (Left) Principal Problem:   OA (osteoarthritis) of knee Active Problems:   Primary osteoarthritis of left knee  Estimated body mass index is 33.45 kg/m as calculated from the following:    Height as of this encounter: '5\' 8"'$  (1.727 m).   Weight as of this encounter: 99.8 kg. Advance diet Up with therapy D/C IV fluids   Patient's anticipated LOS is less than 2 midnights, meeting these requirements: - Younger than 76 - Lives within 1 hour of care - Has a competent adult at home to recover with post-op recover - NO history of  - Chronic pain requiring opiods  - Diabetes  - Coronary Artery Disease  - Heart failure  - Heart attack  - Stroke  - DVT/VTE  - Cardiac arrhythmia  - Respiratory Failure/COPD  - Renal failure  - Anemia  - Advanced Liver disease  DVT Prophylaxis - Aspirin Weight bearing as tolerated. Continue therapy.  Creatinine bumped slightly this AM and I/O's in the negatives. Bolus ordered.   Plan is to go Home after hospital stay. Plan for discharge later today if progresses with therapy and is meeting her goals. Scheduled for OPPT at [facility] in [place]. Follow-up in the office in 2 weeks  The PDMP database was reviewed today prior to any opioid medications being prescribed to this patient.  Theresa Duty, PA-C Orthopedic Surgery [phone] 08/24/2020, 7:12 AM

## 2020-08-24 NOTE — Progress Notes (Signed)
Physical Therapy Treatment Patient Details Name: Amy Newman MRN: [id number] DOB: 1950/07/31 Today's Date: 08/24/2020    History of Present Illness s/p L TKA PMH: obesity, HTN, Right RCR    PT Comments    Pt progressing well. Will see for a second session and pt will likely be ready fo d/c later today.   Follow Up Recommendations  Follow surgeon's recommendation for DC plan and follow-up therapies     Equipment Recommendations  Rolling walker with 5" wheels;3in1 (PT)    Recommendations for Other Services       Precautions / Restrictions Precautions Precautions: Fall;Knee Knee Immobilizer - Left: Discontinue once straight leg raise with < 10 degree lag Restrictions Weight Bearing Restrictions: No Other Position/Activity Restrictions: WBAT    Mobility  Bed Mobility Overal bed mobility: Needs Assistance Bed Mobility: Supine to Sit     Supine to sit: Supervision     General bed mobility comments: incr time and effort, cues to self assist    Transfers Overall transfer level: Needs assistance Equipment used: Rolling walker (2 wheeled) Transfers: Sit to/from Stand Sit to Stand: Min guard         General transfer comment: cues for hand placement, LE position and wt shift  Ambulation/Gait Ambulation/Gait assistance: Min guard Gait Distance (Feet): 60 Feet Assistive device: Rolling walker (2 wheeled) Gait Pattern/deviations: Step-to pattern;Decreased stance time - left     General Gait Details: cues for sequence, trunk extension and RW position   Stairs             Wheelchair Mobility    Modified Rankin (Stroke Patients Only)       Balance                                            Cognition Arousal/Alertness: Awake/alert Behavior During Therapy: WFL for tasks assessed/performed Overall Cognitive Status: Within Functional Limits for tasks assessed                                        Exercises  Total Joint Exercises Ankle Circles/Pumps: AROM;Both;10 reps Quad Sets: AROM;Both;10 reps Heel Slides: AAROM;Left;10 reps Straight Leg Raises: AROM;Left;15 reps Knee Flexion: AROM;Left;5 reps Goniometric ROM: grossly 10 to 70 degrees L knee flxion    General Comments        Pertinent Vitals/Pain Pain Assessment: 0-10 Pain Score: 3  Pain Location: L knee Pain Descriptors / Indicators: Aching Pain Intervention(s): Limited activity within patient's tolerance;Monitored during session;Premedicated before session;Repositioned    Home Living                      Prior Function            PT Goals (current goals can now be found in the care plan section) Acute Rehab PT Goals Patient Stated Goal: walk better PT Goal Formulation: With patient Time For Goal Achievement: 08/30/20 Progress towards PT goals: Progressing toward goals    Frequency    7X/week      PT Plan Current plan remains appropriate    Co-evaluation              AM-PAC PT "6 Clicks" Mobility   Outcome Measure  Help needed turning from your back to your side while in a flat  bed without using bedrails?: A Little Help needed moving from lying on your back to sitting on the side of a flat bed without using bedrails?: None Help needed moving to and from a bed to a chair (including a wheelchair)?: A Little Help needed standing up from a chair using your arms (e.g., wheelchair or bedside chair)?: A Little Help needed to walk in hospital room?: A Little Help needed climbing 3-5 steps with a railing? : A Little 6 Click Score: 19    End of Session Equipment Utilized During Treatment: Gait belt Activity Tolerance: Patient tolerated treatment well Patient left: with call bell/phone within reach;in chair;with chair alarm set Nurse Communication: Mobility status PT Visit Diagnosis: Other abnormalities of gait and mobility (R26.89);Difficulty in walking, not elsewhere classified (R26.2)     Time:  [id number] PT Time Calculation (min) (ACUTE ONLY): 24 min  Charges:  $Gait Training: 8-22 mins $Therapeutic Exercise: 8-22 mins                     Baxter Flattery, PT  Acute Rehab Dept ([place]) [phone] Pager [phone]  08/24/2020    [facility] 08/24/2020, 10:37 AM

## 2020-08-24 NOTE — TOC Transition Note (Signed)
Transition of Care [facility]) - CM/SW Discharge Note   Patient Details  Name: Amy Newman MRN: [id number] Date of Birth: July 25, 1950  [facility] CM/SW Contact:  Lennart Pall, LCSW Phone Number: 08/24/2020, 9:49 AM   Clinical Narrative:    Met briefly with pt and confirming she has received rolling walker.  Plan for OPPT at [facility]).   No further TOC needs.   Final next level of care: OP Rehab Barriers to Discharge: No Barriers Identified   Patient Goals and CMS Choice Patient states their goals for this hospitalization and ongoing recovery are:: return home      Discharge Placement                       Discharge Plan and Services                DME Arranged: Walker rolling DME Agency: [facility]                  Social Determinants of Health (SDOH) Interventions     Readmission Risk Interventions No flowsheet data found.

## 2020-08-24 NOTE — Progress Notes (Signed)
Physical Therapy Treatment Patient Details Name: Amy Newman MRN: [id number] DOB: 1950-07-23 Today's Date: 08/24/2020    History of Present Illness s/p L TKA PMH: obesity, HTN, Right RCR    PT Comments    Pt progressing well, meeting PT goals. Reviewed mobility/safety as noted  below, knee precautions. Pt ready to d/c with family assist as needed from PT standpoint.   Follow Up Recommendations  Follow surgeon's recommendation for DC plan and follow-up therapies     Equipment Recommendations  Rolling walker with 5" wheels;3in1 (PT)    Recommendations for Other Services       Precautions / Restrictions Precautions Precautions: Fall;Knee Knee Immobilizer - Left: Discontinue once straight leg raise with < 10 degree lag Restrictions Other Position/Activity Restrictions: WBAT    Mobility  Bed Mobility Overal bed mobility: Needs Assistance Bed Mobility: Supine to Sit     Supine to sit: Supervision     General bed mobility comments: incr time and effort, cues to self assist    Transfers Overall transfer level: Needs assistance Equipment used: Rolling walker (2 wheeled) Transfers: Sit to/from Stand Sit to Stand: Min guard;Supervision         General transfer comment: cues for hand placement, LE position and wt shift, incr time, use of momentum to stand from lower surface  Ambulation/Gait Ambulation/Gait assistance: Min guard;Supervision Gait Distance (Feet): 55 Feet Assistive device: Rolling walker (2 wheeled) Gait Pattern/deviations: Step-to pattern;Decreased stance time - left     General Gait Details: cues for sequence, trunk extension and RW position. much improved posture during gait   Stairs Stairs: Yes Stairs assistance: Min guard Stair Management: Two rails;Step to pattern;Forwards Number of Stairs: 3 General stair comments: cues for sequence, min/guard for safety   Wheelchair Mobility    Modified Rankin (Stroke Patients Only)        Balance                                            Cognition Arousal/Alertness: Awake/alert Behavior During Therapy: WFL for tasks assessed/performed Overall Cognitive Status: Within Functional Limits for tasks assessed                                        Exercises Total Joint Exercises Ankle Circles/Pumps: AROM;Both;10 reps    General Comments        Pertinent Vitals/Pain Pain Assessment: 0-10 Pain Score: 2  Pain Location: L knee Pain Descriptors / Indicators: Discomfort;Sore    Home Living                      Prior Function            PT Goals (current goals can now be found in the care plan section) Acute Rehab PT Goals Patient Stated Goal: walk better PT Goal Formulation: With patient Time For Goal Achievement: 08/30/20 Progress towards PT goals: Progressing toward goals    Frequency    7X/week      PT Plan Current plan remains appropriate    Co-evaluation              AM-PAC PT "6 Clicks" Mobility   Outcome Measure  Help needed turning from your back to your side while in a flat bed without using bedrails?: A  Little Help needed moving from lying on your back to sitting on the side of a flat bed without using bedrails?: None Help needed moving to and from a bed to a chair (including a wheelchair)?: A Little Help needed standing up from a chair using your arms (e.g., wheelchair or bedside chair)?: A Little Help needed to walk in hospital room?: A Little Help needed climbing 3-5 steps with a railing? : A Little 6 Click Score: 19    End of Session Equipment Utilized During Treatment: Gait belt Activity Tolerance: Patient tolerated treatment well Patient left: with call bell/phone within reach;in chair;with chair alarm set Nurse Communication: Mobility status PT Visit Diagnosis: Other abnormalities of gait and mobility (R26.89);Difficulty in walking, not elsewhere classified (R26.2)     Time:  [id number] PT Time Calculation (min) (ACUTE ONLY): 19 min  Charges:  $Gait Training: 8-22 mins                     Baxter Flattery, PT  Acute Rehab Dept ([place]) [phone] Pager [phone]  08/24/2020    [facility] 08/24/2020, 3:51 PM

## 2020-08-26 DIAGNOSIS — R531 Weakness: Secondary | ICD-10-CM | POA: Diagnosis not present

## 2020-08-26 DIAGNOSIS — R2689 Other abnormalities of gait and mobility: Secondary | ICD-10-CM | POA: Diagnosis not present

## 2020-08-26 DIAGNOSIS — Z96652 Presence of left artificial knee joint: Secondary | ICD-10-CM | POA: Diagnosis not present

## 2020-08-26 DIAGNOSIS — Z4789 Encounter for other orthopedic aftercare: Secondary | ICD-10-CM | POA: Diagnosis not present

## 2020-08-26 DIAGNOSIS — M25662 Stiffness of left knee, not elsewhere classified: Secondary | ICD-10-CM | POA: Diagnosis not present

## 2020-08-26 DIAGNOSIS — M25562 Pain in left knee: Secondary | ICD-10-CM | POA: Diagnosis not present

## 2020-08-30 DIAGNOSIS — M25662 Stiffness of left knee, not elsewhere classified: Secondary | ICD-10-CM | POA: Diagnosis not present

## 2020-08-30 DIAGNOSIS — R2689 Other abnormalities of gait and mobility: Secondary | ICD-10-CM | POA: Diagnosis not present

## 2020-08-30 DIAGNOSIS — Z96652 Presence of left artificial knee joint: Secondary | ICD-10-CM | POA: Diagnosis not present

## 2020-08-30 DIAGNOSIS — Z4789 Encounter for other orthopedic aftercare: Secondary | ICD-10-CM | POA: Diagnosis not present

## 2020-08-30 DIAGNOSIS — M25562 Pain in left knee: Secondary | ICD-10-CM | POA: Diagnosis not present

## 2020-08-30 DIAGNOSIS — R531 Weakness: Secondary | ICD-10-CM | POA: Diagnosis not present

## 2020-08-30 NOTE — Discharge Summary (Signed)
Physician Discharge Summary   Patient ID: Amy Newman MRN: [id number] DOB/AGE: 1950/01/19 70 y.o.  Admit date: 08/23/2020 Discharge date: 08/24/2020  Primary Diagnosis: Osteoarthritis, left knee   Admission Diagnoses:  Past Medical History:  Diagnosis Date   Arthritis    Cancer ([place])    cervical cancer   H/O: cesarean section    Hypertension    Obese    PONV (postoperative nausea and vomiting)    Discharge Diagnoses:   Principal Problem:   OA (osteoarthritis) of knee Active Problems:   Primary osteoarthritis of left knee  Estimated body mass index is 33.45 kg/m as calculated from the following:   Height as of this encounter: '5\' 8"'$  (1.727 m).   Weight as of this encounter: 99.8 kg.  Procedure:  Procedure(s) (LRB): TOTAL KNEE ARTHROPLASTY (Left)   Consults: None  HPI: Amy Newman is a 70 y.o. year old female with end stage OA of her left knee with progressively worsening pain and dysfunction. She has constant pain, with activity and at rest and significant functional deficits with difficulties even with ADLs. She has had extensive non-op management including analgesics, injections of cortisone and viscosupplements, and home exercise program, but remains in significant pain with significant dysfunction. Radiographs show bone on bone arthritis medial and patellofemoral. She presents now for left Total Knee Arthroplasty.     Laboratory Data: Admission on 08/23/2020, Discharged on 08/24/2020  Component Date Value Ref Range Status   ABO/RH(D) 08/23/2020    Final                   Value:A POS Performed at [facility], [place] [address], [place], [place] [address]    WBC 08/24/2020 10.0  4.0 - 10.5 K/uL Final   RBC 08/24/2020 3.27 (A) 3.87 - 5.11 MIL/uL Final   Hemoglobin 08/24/2020 10.1 (A) 12.0 - 15.0 g/dL Final   HCT 08/24/2020 31.6 (A) 36.0 - 46.0 % Final   MCV 08/24/2020 96.6  80.0 - 100.0 fL Final   MCH 08/24/2020 30.9  26.0 - 34.0 pg  Final   MCHC 08/24/2020 32.0  30.0 - 36.0 g/dL Final   RDW 08/24/2020 12.9  11.5 - 15.5 % Final   Platelets 08/24/2020 193  150 - 400 K/uL Final   nRBC 08/24/2020 0.0  0.0 - 0.2 % Final   Performed at [facility], [place] [address], [place], [place] [address]   Sodium 08/24/2020 140  135 - 145 mmol/L Final   Potassium 08/24/2020 3.9  3.5 - 5.1 mmol/L Final   Chloride 08/24/2020 105  98 - 111 mmol/L Final   CO2 08/24/2020 26  22 - 32 mmol/L Final   Glucose, Bld 08/24/2020 176 (A) 70 - 99 mg/dL Final   Glucose reference range applies only to samples taken after fasting for at least 8 hours.   BUN 08/24/2020 20  8 - 23 mg/dL Final   Creatinine, Ser 08/24/2020 1.29 (A) 0.44 - 1.00 mg/dL Final   Calcium 08/24/2020 8.4 (A) 8.9 - 10.3 mg/dL Final   GFR, Estimated 08/24/2020 45 (A) >60 mL/min Final   Comment: (NOTE) Calculated using the CKD-EPI Creatinine Equation (2021)    Anion gap 08/24/2020 9  5 - 15 Final   Performed at [facility], [place] [address] [place], [place] [address]  Orders Only on 08/19/2020  Component Date Value Ref Range Status   SARS Coronavirus 2 08/19/2020 RESULT: NEGATIVE   Final   Comment: RESULT: NEGATIVESARS-CoV-2  INTERPRETATION:A NEGATIVE  test result means that SARS-CoV-2 RNA was not present in the specimen above the limit of detection of this test. This does not preclude a possible SARS-CoV-2 infection and should not be used as the  sole basis for patient management decisions. Negative results must be combined with clinical observations, patient history, and epidemiological information. Optimum specimen types and timing for peak viral levels during infections caused by SARS-CoV-2  have not been determined. Collection of multiple specimens or types of specimens may be necessary to detect virus. Improper specimen collection and handling, sequence variability under primers/probes, or organism present below the limit of detection may   lead to false negative results. Positive and negative predictive values of testing are highly dependent on prevalence. False negative test results are more likely when prevalence of disease is high.The expected result is NEGATIVE.Fact S                          heet for  Healthcare Providers: LocalChronicle.no Sheet for Patients: SalonLookup.es Reference Range - Negative   Hospital Outpatient Visit on 08/11/2020  Component Date Value Ref Range Status   MRSA, PCR 08/11/2020 NEGATIVE  NEGATIVE Final   Staphylococcus aureus 08/11/2020 NEGATIVE  NEGATIVE Final   Comment: (NOTE) The Xpert SA Assay (FDA approved for NASAL specimens in patients 77 years of age and older), is one component of a comprehensive surveillance program. It is not intended to diagnose infection nor to guide or monitor treatment. Performed at [facility], [place] [address], [place], [place] [address]    WBC 08/11/2020 5.8  4.0 - 10.5 K/uL Final   RBC 08/11/2020 4.09  3.87 - 5.11 MIL/uL Final   Hemoglobin 08/11/2020 12.6  12.0 - 15.0 g/dL Final   HCT 08/11/2020 40.0  36.0 - 46.0 % Final   MCV 08/11/2020 97.8  80.0 - 100.0 fL Final   MCH 08/11/2020 30.8  26.0 - 34.0 pg Final   MCHC 08/11/2020 31.5  30.0 - 36.0 g/dL Final   RDW 08/11/2020 12.9  11.5 - 15.5 % Final   Platelets 08/11/2020 230  150 - 400 K/uL Final   nRBC 08/11/2020 0.0  0.0 - 0.2 % Final   Performed at [facility], [place] [address], [place], [place] [address]   Sodium 08/11/2020 141  135 - 145 mmol/L Final   Potassium 08/11/2020 4.2  3.5 - 5.1 mmol/L Final   Chloride 08/11/2020 108  98 - 111 mmol/L Final   CO2 08/11/2020 23  22 - 32 mmol/L Final   Glucose, Bld 08/11/2020 109 (A) 70 - 99 mg/dL Final   Glucose reference range applies only to samples taken after fasting for at least 8 hours.   BUN 08/11/2020 19  8 - 23 mg/dL Final   Creatinine, Ser 08/11/2020  1.04 (A) 0.44 - 1.00 mg/dL Final   Calcium 08/11/2020 9.3  8.9 - 10.3 mg/dL Final   Total Protein 08/11/2020 7.3  6.5 - 8.1 g/dL Final   Albumin 08/11/2020 3.9  3.5 - 5.0 g/dL Final   AST 08/11/2020 18  15 - 41 U/L Final   ALT 08/11/2020 17  0 - 44 U/L Final   Alkaline Phosphatase 08/11/2020 68  38 - 126 U/L Final   Total Bilirubin 08/11/2020 0.3  0.3 - 1.2 mg/dL Final   GFR, Estimated 08/11/2020 59 (A) >60 mL/min Final   Comment: (NOTE) Calculated using the CKD-EPI Creatinine Equation (2021)    Anion  gap 08/11/2020 10  5 - 15 Final   Performed at [facility], [place] [address], [place], [place] [address]   Prothrombin Time 08/11/2020 12.8  11.4 - 15.2 seconds Final   INR 08/11/2020 1.0  0.8 - 1.2 Final   Comment: (NOTE) INR goal varies based on device and disease states. Performed at [facility], [place] [address] [place], [place] [address]    ABO/RH(D) 08/11/2020 A POS   Final   Antibody Screen 08/11/2020 NEG   Final   Sample Expiration 08/11/2020 08/25/2020,2359   Final   Extend sample reason 08/11/2020    Final                   Value:NO TRANSFUSIONS OR PREGNANCY IN THE PAST 3 MONTHS Performed at [place] [address], [place], [place] [address]      X-Rays:No results found.  EKG: Orders placed or performed during the hospital encounter of 08/11/20   EKG 12 lead per protocol   EKG 12 lead per protocol     Hospital Course: Amy Newman is a 70 y.o. who was admitted to [facility]. They were brought to the operating room on 08/23/2020 and underwent Procedure(s): TOTAL KNEE ARTHROPLASTY.  Patient tolerated the procedure well and was later transferred to the recovery room and then to the orthopaedic floor for postoperative care. They were given PO and IV analgesics for pain control following their surgery. They were given 24 hours of postoperative antibiotics of  Anti-infectives (From admission,  onward)    Start     Dose/Rate Route Frequency Ordered Stop   08/23/20 1600  vancomycin (VANCOCIN) IVPB 1000 mg/200 mL premix        1,000 mg 200 mL/hr over 60 Minutes Intravenous Every 12 hours 08/23/20 1107 08/23/20 1806   08/23/20 0645  vancomycin (VANCOCIN) IVPB 1000 mg/200 mL premix  Status:  Discontinued        1,000 mg 200 mL/hr over 60 Minutes Intravenous  Once 08/23/20 0634 08/23/20 1107   08/23/20 0645  ceFAZolin (ANCEF) IVPB 2g/100 mL premix        2 g 200 mL/hr over 30 Minutes Intravenous  Once 08/23/20 [id number] 08/23/20 0843      and started on DVT prophylaxis in the form of Aspirin.   PT and OT were ordered for total joint protocol. Discharge planning consulted to help with postop disposition and equipment needs.  Patient had a good night on the evening of surgery. They started to get up OOB with therapy on POD #0. Pt was seen during rounds and was ready to go home pending progress with therapy. Creatine bumped slightly with AM labs with intake/output total in the negatives. A 500 mL bolus was ordered for dehydration. She worked with therapy on POD #1 and was meeting her goals. Pt was discharged to home later that day in stable condition.  Diet: Regular diet Activity: WBAT Follow-up: in 2 weeks Disposition: Home with OPPT Discharged Condition: stable   Discharge Instructions     Call MD / Call 911   Complete by: As directed    If you experience chest pain or shortness of breath, CALL 911 and be transported to the hospital emergency room.  If you develope a fever above 101 F, pus (white drainage) or increased drainage or redness at the wound, or calf pain, call your surgeon's office.   Change dressing   Complete by: As directed  You may remove the bulky bandage (ACE wrap and gauze) two days after surgery. You will have an adhesive waterproof bandage underneath. Leave this in place until your first follow-up appointment.   Constipation Prevention   Complete by: As directed     Drink plenty of fluids.  Prune juice may be helpful.  You may use a stool softener, such as Colace (over the counter) 100 mg twice a day.  Use MiraLax (over the counter) for constipation as needed.   Diet - low sodium heart healthy   Complete by: As directed    Do not put a pillow under the knee. Place it under the heel.   Complete by: As directed    Driving restrictions   Complete by: As directed    No driving for two weeks   Post-operative opioid taper instructions:   Complete by: As directed    POST-OPERATIVE OPIOID TAPER INSTRUCTIONS: It is important to wean off of your opioid medication as soon as possible. If you do not need pain medication after your surgery it is ok to stop day one. Opioids include: Codeine, Hydrocodone(Norco, Vicodin), Oxycodone(Percocet, oxycontin) and hydromorphone amongst others.  Long term and even short term use of opiods can cause: Increased pain response Dependence Constipation Depression Respiratory depression And more.  Withdrawal symptoms can include Flu like symptoms Nausea, vomiting And more Techniques to manage these symptoms Hydrate well Eat regular healthy meals Stay active Use relaxation techniques(deep breathing, meditating, yoga) Do Not substitute Alcohol to help with tapering If you have been on opioids for less than two weeks and do not have pain than it is ok to stop all together.  Plan to wean off of opioids This plan should start within one week post op of your joint replacement. Maintain the same interval or time between taking each dose and first decrease the dose.  Cut the total daily intake of opioids by one tablet each day Next start to increase the time between doses. The last dose that should be eliminated is the evening dose.      TED hose   Complete by: As directed    Use stockings (TED hose) for three weeks on both leg(s).  You may remove them at night for sleeping.   Weight bearing as tolerated   Complete by: As  directed       Allergies as of 08/24/2020       Reactions   Hydrocodone Nausea Only   Tramadol Nausea Only   Penicillins Hives, Swelling, Rash   Tolerated Ancef 2 grams 08-23-20        Medication List     STOP taking these medications    ibuprofen 800 MG tablet Commonly known as: ADVIL   lidocaine 5 % Commonly known as: Lidoderm       TAKE these medications    aspirin 325 MG EC tablet Take 1 tablet (325 mg total) by mouth 2 (two) times daily for 20 days. Then resume one 81 mg aspirin once a day. What changed:  medication strength how much to take when to take this additional instructions   atenolol 50 MG tablet Commonly known as: TENORMIN Take 50 mg by mouth daily.   atorvastatin 20 MG tablet Commonly known as: LIPITOR Take 20 mg by mouth at bedtime.   brimonidine 0.2 % ophthalmic solution Commonly known as: ALPHAGAN Place 1 drop into both eyes 2 (two) times daily.   cetirizine 10 MG tablet Commonly known as: ZYRTEC Take 10 mg by  mouth daily.   CITRACAL PO Take 1 tablet by mouth daily.   gabapentin 300 MG capsule Commonly known as: NEURONTIN Take a 300 mg capsule three times a day for two weeks following surgery.Then take a 300 mg capsule two times a day for two weeks. Then take a 300 mg capsule once a day for two weeks. Then discontinue. What changed:  medication strength how much to take how to take this when to take this additional instructions   latanoprost 0.005 % ophthalmic solution Commonly known as: XALATAN Place 1 drop into both eyes at bedtime.   meclizine 25 MG tablet Commonly known as: ANTIVERT Take 25 mg by mouth every 6 (six) hours as needed for dizziness.   methocarbamol 500 MG tablet Commonly known as: ROBAXIN Take 1 tablet (500 mg total) by mouth every 6 (six) hours as needed for muscle spasms.   oxyCODONE 5 MG immediate release tablet Commonly known as: Oxy IR/ROXICODONE Take 1-2 tablets (5-10 mg total) by mouth every  6 (six) hours as needed for moderate pain or severe pain.               Discharge Care Instructions  (From admission, onward)           Start     Ordered   08/24/20 0000  Weight bearing as tolerated        08/24/20 0716   08/24/20 0000  Change dressing       Comments: You may remove the bulky bandage (ACE wrap and gauze) two days after surgery. You will have an adhesive waterproof bandage underneath. Leave this in place until your first follow-up appointment.   08/24/20 0716            Follow-up Information     Gaynelle Arabian, MD. Go on 09/07/2020.   Specialty: Orthopedic Surgery Why: You are scheduled for first post op appointment on Tuesday September 6th at 1:15pm. Contact information: [address] [place] [address] [phone]                 Signed: Theresa Duty, PA-C Orthopedic Surgery 08/30/2020, 10:37 AM

## 2020-08-31 DIAGNOSIS — R2689 Other abnormalities of gait and mobility: Secondary | ICD-10-CM | POA: Diagnosis not present

## 2020-08-31 DIAGNOSIS — M25662 Stiffness of left knee, not elsewhere classified: Secondary | ICD-10-CM | POA: Diagnosis not present

## 2020-08-31 DIAGNOSIS — Z96652 Presence of left artificial knee joint: Secondary | ICD-10-CM | POA: Diagnosis not present

## 2020-08-31 DIAGNOSIS — M25562 Pain in left knee: Secondary | ICD-10-CM | POA: Diagnosis not present

## 2020-08-31 DIAGNOSIS — R531 Weakness: Secondary | ICD-10-CM | POA: Diagnosis not present

## 2020-08-31 DIAGNOSIS — Z4789 Encounter for other orthopedic aftercare: Secondary | ICD-10-CM | POA: Diagnosis not present

## 2020-09-03 DIAGNOSIS — Z96652 Presence of left artificial knee joint: Secondary | ICD-10-CM | POA: Diagnosis not present

## 2020-09-03 DIAGNOSIS — M25562 Pain in left knee: Secondary | ICD-10-CM | POA: Diagnosis not present

## 2020-09-03 DIAGNOSIS — M25662 Stiffness of left knee, not elsewhere classified: Secondary | ICD-10-CM | POA: Diagnosis not present

## 2020-09-03 DIAGNOSIS — R2689 Other abnormalities of gait and mobility: Secondary | ICD-10-CM | POA: Diagnosis not present

## 2020-09-03 DIAGNOSIS — Z4789 Encounter for other orthopedic aftercare: Secondary | ICD-10-CM | POA: Diagnosis not present

## 2020-09-03 DIAGNOSIS — R531 Weakness: Secondary | ICD-10-CM | POA: Diagnosis not present

## 2020-09-08 DIAGNOSIS — Z4789 Encounter for other orthopedic aftercare: Secondary | ICD-10-CM | POA: Diagnosis not present

## 2020-09-08 DIAGNOSIS — R531 Weakness: Secondary | ICD-10-CM | POA: Diagnosis not present

## 2020-09-08 DIAGNOSIS — Z96652 Presence of left artificial knee joint: Secondary | ICD-10-CM | POA: Diagnosis not present

## 2020-09-08 DIAGNOSIS — R2689 Other abnormalities of gait and mobility: Secondary | ICD-10-CM | POA: Diagnosis not present

## 2020-09-08 DIAGNOSIS — M25662 Stiffness of left knee, not elsewhere classified: Secondary | ICD-10-CM | POA: Diagnosis not present

## 2020-09-08 DIAGNOSIS — M25562 Pain in left knee: Secondary | ICD-10-CM | POA: Diagnosis not present

## 2020-09-09 DIAGNOSIS — M25662 Stiffness of left knee, not elsewhere classified: Secondary | ICD-10-CM | POA: Diagnosis not present

## 2020-09-09 DIAGNOSIS — R2689 Other abnormalities of gait and mobility: Secondary | ICD-10-CM | POA: Diagnosis not present

## 2020-09-09 DIAGNOSIS — Z4789 Encounter for other orthopedic aftercare: Secondary | ICD-10-CM | POA: Diagnosis not present

## 2020-09-09 DIAGNOSIS — Z96652 Presence of left artificial knee joint: Secondary | ICD-10-CM | POA: Diagnosis not present

## 2020-09-09 DIAGNOSIS — R531 Weakness: Secondary | ICD-10-CM | POA: Diagnosis not present

## 2020-09-09 DIAGNOSIS — M25562 Pain in left knee: Secondary | ICD-10-CM | POA: Diagnosis not present

## 2020-09-13 DIAGNOSIS — Z96652 Presence of left artificial knee joint: Secondary | ICD-10-CM | POA: Diagnosis not present

## 2020-09-13 DIAGNOSIS — M25662 Stiffness of left knee, not elsewhere classified: Secondary | ICD-10-CM | POA: Diagnosis not present

## 2020-09-13 DIAGNOSIS — M25562 Pain in left knee: Secondary | ICD-10-CM | POA: Diagnosis not present

## 2020-09-13 DIAGNOSIS — Z4789 Encounter for other orthopedic aftercare: Secondary | ICD-10-CM | POA: Diagnosis not present

## 2020-09-13 DIAGNOSIS — R531 Weakness: Secondary | ICD-10-CM | POA: Diagnosis not present

## 2020-09-13 DIAGNOSIS — R2689 Other abnormalities of gait and mobility: Secondary | ICD-10-CM | POA: Diagnosis not present

## 2020-09-15 DIAGNOSIS — R2689 Other abnormalities of gait and mobility: Secondary | ICD-10-CM | POA: Diagnosis not present

## 2020-09-15 DIAGNOSIS — Z4789 Encounter for other orthopedic aftercare: Secondary | ICD-10-CM | POA: Diagnosis not present

## 2020-09-15 DIAGNOSIS — Z96652 Presence of left artificial knee joint: Secondary | ICD-10-CM | POA: Diagnosis not present

## 2020-09-15 DIAGNOSIS — M25662 Stiffness of left knee, not elsewhere classified: Secondary | ICD-10-CM | POA: Diagnosis not present

## 2020-09-15 DIAGNOSIS — M25562 Pain in left knee: Secondary | ICD-10-CM | POA: Diagnosis not present

## 2020-09-15 DIAGNOSIS — R531 Weakness: Secondary | ICD-10-CM | POA: Diagnosis not present

## 2020-09-17 DIAGNOSIS — Z4789 Encounter for other orthopedic aftercare: Secondary | ICD-10-CM | POA: Diagnosis not present

## 2020-09-17 DIAGNOSIS — M25562 Pain in left knee: Secondary | ICD-10-CM | POA: Diagnosis not present

## 2020-09-17 DIAGNOSIS — R2689 Other abnormalities of gait and mobility: Secondary | ICD-10-CM | POA: Diagnosis not present

## 2020-09-17 DIAGNOSIS — Z96652 Presence of left artificial knee joint: Secondary | ICD-10-CM | POA: Diagnosis not present

## 2020-09-17 DIAGNOSIS — M25662 Stiffness of left knee, not elsewhere classified: Secondary | ICD-10-CM | POA: Diagnosis not present

## 2020-09-17 DIAGNOSIS — R531 Weakness: Secondary | ICD-10-CM | POA: Diagnosis not present

## 2020-09-20 ENCOUNTER — Inpatient Hospital Stay: Payer: Medicare HMO | Attending: Hematology and Oncology | Admitting: Hematology and Oncology

## 2020-09-20 DIAGNOSIS — R2689 Other abnormalities of gait and mobility: Secondary | ICD-10-CM | POA: Diagnosis not present

## 2020-09-20 DIAGNOSIS — M25662 Stiffness of left knee, not elsewhere classified: Secondary | ICD-10-CM | POA: Diagnosis not present

## 2020-09-20 DIAGNOSIS — R531 Weakness: Secondary | ICD-10-CM | POA: Diagnosis not present

## 2020-09-20 DIAGNOSIS — Z4789 Encounter for other orthopedic aftercare: Secondary | ICD-10-CM | POA: Diagnosis not present

## 2020-09-20 DIAGNOSIS — M25562 Pain in left knee: Secondary | ICD-10-CM | POA: Diagnosis not present

## 2020-09-20 DIAGNOSIS — Z96652 Presence of left artificial knee joint: Secondary | ICD-10-CM | POA: Diagnosis not present

## 2020-09-22 DIAGNOSIS — Z4789 Encounter for other orthopedic aftercare: Secondary | ICD-10-CM | POA: Diagnosis not present

## 2020-09-22 DIAGNOSIS — M25562 Pain in left knee: Secondary | ICD-10-CM | POA: Diagnosis not present

## 2020-09-22 DIAGNOSIS — Z96652 Presence of left artificial knee joint: Secondary | ICD-10-CM | POA: Diagnosis not present

## 2020-09-22 DIAGNOSIS — R2689 Other abnormalities of gait and mobility: Secondary | ICD-10-CM | POA: Diagnosis not present

## 2020-09-22 DIAGNOSIS — M25662 Stiffness of left knee, not elsewhere classified: Secondary | ICD-10-CM | POA: Diagnosis not present

## 2020-09-22 DIAGNOSIS — R531 Weakness: Secondary | ICD-10-CM | POA: Diagnosis not present

## 2020-09-24 ENCOUNTER — Telehealth: Payer: Self-pay | Admitting: Hematology and Oncology

## 2020-09-24 DIAGNOSIS — R531 Weakness: Secondary | ICD-10-CM | POA: Diagnosis not present

## 2020-09-24 DIAGNOSIS — M25662 Stiffness of left knee, not elsewhere classified: Secondary | ICD-10-CM | POA: Diagnosis not present

## 2020-09-24 DIAGNOSIS — Z96652 Presence of left artificial knee joint: Secondary | ICD-10-CM | POA: Diagnosis not present

## 2020-09-24 DIAGNOSIS — M25562 Pain in left knee: Secondary | ICD-10-CM | POA: Diagnosis not present

## 2020-09-24 DIAGNOSIS — Z4789 Encounter for other orthopedic aftercare: Secondary | ICD-10-CM | POA: Diagnosis not present

## 2020-09-24 DIAGNOSIS — R2689 Other abnormalities of gait and mobility: Secondary | ICD-10-CM | POA: Diagnosis not present

## 2020-09-24 NOTE — Telephone Encounter (Signed)
R/s pt's missed appt with Dr. Chryl Heck. Pt stated she was unaware of appt. R/s pt per her convenience. Pt is aware of new appt date and time.

## 2020-09-27 DIAGNOSIS — M25562 Pain in left knee: Secondary | ICD-10-CM | POA: Diagnosis not present

## 2020-09-27 DIAGNOSIS — R531 Weakness: Secondary | ICD-10-CM | POA: Diagnosis not present

## 2020-09-27 DIAGNOSIS — Z96652 Presence of left artificial knee joint: Secondary | ICD-10-CM | POA: Diagnosis not present

## 2020-09-27 DIAGNOSIS — Z4789 Encounter for other orthopedic aftercare: Secondary | ICD-10-CM | POA: Diagnosis not present

## 2020-09-27 DIAGNOSIS — R2689 Other abnormalities of gait and mobility: Secondary | ICD-10-CM | POA: Diagnosis not present

## 2020-09-27 DIAGNOSIS — M25662 Stiffness of left knee, not elsewhere classified: Secondary | ICD-10-CM | POA: Diagnosis not present

## 2020-09-28 DIAGNOSIS — Z96652 Presence of left artificial knee joint: Secondary | ICD-10-CM | POA: Diagnosis not present

## 2020-09-28 DIAGNOSIS — Z471 Aftercare following joint replacement surgery: Secondary | ICD-10-CM | POA: Diagnosis not present

## 2020-09-29 ENCOUNTER — Ambulatory Visit: Payer: Medicare HMO | Admitting: Neurology

## 2020-09-29 ENCOUNTER — Encounter: Payer: Self-pay | Admitting: Neurology

## 2020-09-29 DIAGNOSIS — M48061 Spinal stenosis, lumbar region without neurogenic claudication: Secondary | ICD-10-CM | POA: Diagnosis not present

## 2020-09-29 HISTORY — DX: Spinal stenosis, lumbar region without neurogenic claudication: M48.061

## 2020-09-29 NOTE — Progress Notes (Signed)
Reason for visit: Lumbar spinal stenosis  Amy Newman is an 71 y.o. female  History of present illness:  Ms. Amy Newman is a 70 year old right-handed black female with a history of right foot pain on presentation.  Work-up revealed evidence of an acute right S1 radiculopathy, MRI of the lumbar spine revealed multilevel severe spinal stenosis.  The patient was seen by Dr. Marcello Moores and surgery was not contemplated at that time.  Currently, the patient is not having a whole lot of back pain with exception when she does a lot of housework.  She denies radicular pain down the legs.  Her main issue at this point is her knee problem, she underwent a left total knee replacement on 23 August 2020, she currently is in physical therapy.  She will need knee surgery on the right in the future.  She denies any weakness in the legs or difficulty controlling the bowels or the bladder.  She denies any discomfort down the legs with walking.  She returns for an evaluation.  Previously, she had an epidural steroid injection in the back that did not have any sustained benefit.  Past Medical History:  Diagnosis Date   Arthritis    Cancer ([place])    cervical cancer   H/O: cesarean section    Hypertension    Obese    PONV (postoperative nausea and vomiting)     Past Surgical History:  Procedure Laterality Date   athroscopic knee surgery bilaterall Bilateral    CARPAL TUNNEL RELEASE Right    ROTATOR CUFF REPAIR Right    TOTAL KNEE ARTHROPLASTY Left 08/23/2020   Procedure: TOTAL KNEE ARTHROPLASTY;  Surgeon: Gaynelle Arabian, MD;  Location: [facility];  Service: Orthopedics;  Laterality: Left;    Family History  Problem Relation Age of Onset   Stroke Mother    Diabetes Mother    Cancer Father    Cancer Sister        breast   Diabetes Brother    Peripheral Artery Disease Brother     Social history:  reports that she has never smoked. She has never been exposed to tobacco smoke. She has never used  smokeless tobacco. She reports that she does not drink alcohol and does not use drugs.    Allergies  Allergen Reactions   Hydrocodone Nausea Only   Tramadol Nausea Only   Penicillins Hives, Swelling and Rash    Tolerated Ancef 2 grams 08-23-20    Medications:  Prior to Admission medications   Medication Sig Start Date End Date Taking? Authorizing Provider  atenolol (TENORMIN) 50 MG tablet Take 50 mg by mouth daily. 08/05/19  Yes [provider]  atorvastatin (LIPITOR) 20 MG tablet Take 20 mg by mouth at bedtime. 08/02/19  Yes [provider]  brimonidine (ALPHAGAN) 0.2 % ophthalmic solution Place 1 drop into both eyes 2 (two) times daily. 07/13/20  Yes [provider]  Calcium Citrate (CITRACAL PO) Take 1 tablet by mouth daily.   Yes [provider]  cetirizine (ZYRTEC) 10 MG tablet Take 10 mg by mouth daily.   Yes [provider]  gabapentin (NEURONTIN) 300 MG capsule Take a 300 mg capsule three times a day for two weeks following surgery.Then take a 300 mg capsule two times a day for two weeks. Then take a 300 mg capsule once a day for two weeks. Then discontinue. 08/24/20  Yes Edmisten, Kristie L, PA  latanoprost (XALATAN) 0.005 % ophthalmic solution Place 1 drop into both  eyes at bedtime. 07/13/20  Yes [provider]    ROS:  Out of a complete 14 system review of symptoms, the patient complains only of the following symptoms, and all other reviewed systems are negative.  Knee pain Walking difficulty  Blood pressure (!) 150/83, pulse 75, height 5\' 8"  (1.727 m), weight 219 lb (99.3 kg).  Physical Exam  General: The patient is alert and cooperative at the time of the examination.  Skin: No significant peripheral edema is noted.   Neurologic Exam  Mental status: The patient is alert and oriented x 3 at the time of the examination. The patient has apparent normal recent and remote memory, with an apparently normal attention span  and concentration ability.   Cranial nerves: Facial symmetry is present. Speech is normal, no aphasia or dysarthria is noted. Extraocular movements are full. Visual fields are full.  Motor: The patient has good strength in all 4 extremities.  Sensory examination: Soft touch sensation is symmetric on the face, arms, and legs.  Coordination: The patient has good finger-nose-finger and heel-to-shin bilaterally.  Gait and station: The patient is currently walking with a walker following the surgery.  Reflexes: Deep tendon reflexes are symmetric.   MRI lumbar 12/07/19:  IMPRESSION: This MRI of the lumbar spine without contrast shows the following: 1.   There is severe spinal stenosis at L1-L2, L2-L3 and L3-L4 and moderate spinal stenosis at L4-L5 and L5-S1 due to various combinations of disc protrusion/herniation, facet hypertrophy and ligamenta flava hypertrophy.  Due to the severity of the spinal stenosis, there is probable nerve root compression at multiple levels. 2.    There is a large renal cyst at the upper pole of the left kidney.   * MRI scan images were reviewed online. I agree with the written report.    Assessment/Plan:  1.  Lumbar spinal stenosis  The patient currently is not having a lot of back pain or leg discomfort.  She will self monitor, if she has recurrence of pain she will contact the office.  We may consider a repeat epidural steroid injection but I suspect at some point in her life she will require a fairly extensive lumbar spine procedure.  She will follow-up here if needed.  Jill Alexanders MD 09/29/2020 10:19 AM  [facility] [address] [place], [place] [address]  Phone [phone] Fax [phone]

## 2020-09-30 ENCOUNTER — Encounter: Payer: Medicare HMO | Admitting: Hematology and Oncology

## 2020-09-30 DIAGNOSIS — M25562 Pain in left knee: Secondary | ICD-10-CM | POA: Diagnosis not present

## 2020-09-30 DIAGNOSIS — Z96652 Presence of left artificial knee joint: Secondary | ICD-10-CM | POA: Diagnosis not present

## 2020-09-30 DIAGNOSIS — R2689 Other abnormalities of gait and mobility: Secondary | ICD-10-CM | POA: Diagnosis not present

## 2020-09-30 DIAGNOSIS — M25662 Stiffness of left knee, not elsewhere classified: Secondary | ICD-10-CM | POA: Diagnosis not present

## 2020-09-30 DIAGNOSIS — Z4789 Encounter for other orthopedic aftercare: Secondary | ICD-10-CM | POA: Diagnosis not present

## 2020-09-30 DIAGNOSIS — R531 Weakness: Secondary | ICD-10-CM | POA: Diagnosis not present

## 2020-10-04 ENCOUNTER — Inpatient Hospital Stay: Payer: Medicare HMO | Attending: Hematology and Oncology | Admitting: Hematology and Oncology

## 2020-10-04 ENCOUNTER — Other Ambulatory Visit: Payer: Self-pay

## 2020-10-04 VITALS — BP 148/76 | HR 72 | Temp 98.0°F | Resp 18 | Wt 217.1 lb

## 2020-10-04 DIAGNOSIS — M1990 Unspecified osteoarthritis, unspecified site: Secondary | ICD-10-CM | POA: Diagnosis not present

## 2020-10-04 DIAGNOSIS — N6099 Unspecified benign mammary dysplasia of unspecified breast: Secondary | ICD-10-CM | POA: Diagnosis not present

## 2020-10-04 NOTE — Progress Notes (Signed)
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  She has seen Dr. Marlou Starks but according to his note, she was reluctant to proceed with surgery.  She is here for an initial visit by herself.  She denies any other prior abnormal breast findings before this.  No family history of breast cancer.  She has arthritis and is anticipating a right knee replacement soon in December and she would like to defer the breast surgery to February if possible.  We have discussed the following details about atypical ductal hyperplasia.  Pathology review: I discussed the difference between atypical ductal hyperplasia, DCIS and invasive breast cancer. I explained to her that atypical ductal hyperplasia  is characterized by a proliferation of uniform epithelial cells filling part, but not the entirety, of the involved duct. ADH is associated with an increased risk of both ipsilateral and contralateral breast cancer and thus provides evidence of underlying breast abnormalities that predispose to breast cancer.   I discussed mechanism of action of tamoxifen, adverse effects of tamoxifen including but not limited to postmenopausal symptoms, increased risk of endometrial cancer, increased risk of DVT/PE and cardiovascular events.  Benefit would be improving bone density. I believe that the risks far outweigh any benefits from tamoxifen.  I do recommend tamoxifen however I do not recommend tamoxifen in the place of surgery.  I recommended that she proceed with surgery as planned and then start tamoxifen to decrease  risk of recurrence.  Also given her upcoming knee replacement and slightly increased risk of DVT/PE with tamoxifen, I do not recommend starting tamoxifen before the knee replacement.  We have also discussed about the following. 1. Exercise 30 minutes daily or 150 minutes a week. 2. Weight loss 3. Increasing fruits and vegetables and less red meat I believe all of the above would extensively decrease her risk of breast cancer as well as other cancers including cancers of the colon substantially.  Surveillance plan: Annual mammograms and breast exams At this time role of screening MRIs for atypical ductal hyperplasia is quite unclear.  Although in theory it increases risk of breast cancer with 3-5 times, there is controversial data about using MRI screening.  She is interested in proceeding with MRI screening if she qualifies.  We will try to order it and she was recommended to come back for follow-up after February.  HISTORY OF PRESENTING ILLNESS:   Amy Newman 70 y.o. female is here because of ADH.  This is a 69 year old female patient who was found to have ADH on right needle core biopsy back in June 2022, seen by Dr. Marlou Starks in breast surgery was reluctant to proceed with surgery and hence referred to high risk breast clinic for discussion of endocrine prophylaxis.  She is doing quite well today except for some arthritis related issues.  She was hoping to get her knee replacement done in December.  She wants to defer breast surgery until she is done with her knee replacement.  She denies any other prior breast biopsies.  She does have family history of breast cancer in a sister.  Rest of the pertinent 10 point ROS reviewed and  negative.  MEDICAL HISTORY:  Past Medical History:  Diagnosis Date   Arthritis    Cancer ([place])    cervical cancer   H/O: cesarean section    Hypertension    Lumbar spinal stenosis 09/29/2020   Obese    PONV (postoperative nausea and vomiting)     SURGICAL  HISTORY: Past Surgical History:  Procedure Laterality Date   athroscopic knee surgery bilaterall Bilateral    CARPAL TUNNEL RELEASE Right    ROTATOR CUFF REPAIR Right    TOTAL KNEE ARTHROPLASTY Left 08/23/2020   Procedure: TOTAL KNEE ARTHROPLASTY;  Surgeon: Gaynelle Arabian, MD;  Location: [facility];  Service: Orthopedics;  Laterality: Left;    SOCIAL HISTORY: Social History   Socioeconomic History   Marital status: Divorced    Spouse name: Not on file   Number of children: Not on file   Years of education: Not on file   Highest education level: Not on file  Occupational History   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   Smoking status: Never    Passive exposure: Never   Smokeless tobacco: Never  Vaping Use   Vaping Use: Never used  Substance and Sexual Activity   Alcohol use: Never   Drug use: Never   Sexual activity: Not on file  Other Topics Concern   Not on file  Social History Narrative   Lives alone   Right Handed   Drinks 1 can soda on Sunday   Social Determinants of Health   Financial Resource Strain: Not on file  Food Insecurity: Not on file  Transportation Needs: Not on file  Physical Activity: Not on file  Stress: Not on file  Social Connections: Not on file  Intimate Partner Violence: Not on file    FAMILY HISTORY: Family History  Problem Relation Age of Onset   Stroke Mother    Diabetes Mother    Cancer Father    Cancer Sister        breast   Diabetes Brother    Peripheral Artery Disease Brother     ALLERGIES:  is allergic to hydrocodone, tramadol, and penicillins.  MEDICATIONS:  Current Outpatient Medications  Medication Sig Dispense Refill   atenolol (TENORMIN) 50 MG tablet Take 50 mg by mouth daily.     atorvastatin (LIPITOR) 20 MG tablet Take 20 mg by mouth at bedtime.     brimonidine (ALPHAGAN) 0.2 % ophthalmic solution Place 1 drop into both eyes 2 (two) times daily.     Calcium Citrate (CITRACAL PO) Take 1  tablet by mouth daily.     cetirizine (ZYRTEC) 10 MG tablet Take 10 mg by mouth daily.     gabapentin (NEURONTIN) 300 MG capsule Take a 300 mg capsule three times a day for two weeks following surgery.Then take a 300 mg capsule two times a day for two weeks. Then take a 300 mg capsule once a day for two weeks. Then discontinue. 84 capsule 0   latanoprost (XALATAN) 0.005 % ophthalmic solution Place 1 drop into both eyes at bedtime.     No current facility-administered medications for this visit.   PHYSICAL EXAMINATION:  ECOG PERFORMANCE STATUS: 0 - Asymptomatic  Vitals:   10/04/20 1144  BP: (!) 148/76  Pulse: 72  Resp: 18  Temp: 98 F (36.7 C)  SpO2: 100%   Filed Weights   10/04/20 1144  Weight: 217 lb 1.6 oz (98.5 kg)    GENERAL:alert, no distress and comfortable Breasts:  Bilateral breasts inspected.  No palpable masses or regional adenopathy. No bilateral lower extremity edema.  LABORATORY DATA:  I have reviewed the data as listed Lab Results  Component Value Date   WBC 10.0 08/24/2020   HGB 10.1 (L) 08/24/2020   HCT 31.6 (L) 08/24/2020   MCV 96.6 08/24/2020   PLT 193 08/24/2020     Chemistry      Component Value Date/Time   NA 140 08/24/2020 0307   K 3.9 08/24/2020 0307   CL 105 08/24/2020 0307   CO2 26 08/24/2020 0307   BUN 20 08/24/2020 0307   CREATININE 1.29 (H) 08/24/2020 0307      Component Value Date/Time   CALCIUM 8.4 (L) 08/24/2020 0307   ALKPHOS 68 08/11/2020 1348   AST 18 08/11/2020 1348   ALT 17 08/11/2020 1348   BILITOT 0.3 08/11/2020 1348     Breast biopsy  ADH with calcifications.  RADIOGRAPHIC STUDIES: I have personally reviewed the radiological images as listed and agreed with the findings in the report. No results found.  All questions were answered. The patient knows to call the clinic with any problems, questions or concerns. I spent 45 minutes in the care of this patient including H and P, review of records, counseling and  coordination of care.     Benay Pike, MD 10/05/2020 8:48 AM

## 2020-10-05 ENCOUNTER — Encounter: Payer: Self-pay | Admitting: Hematology and Oncology

## 2020-10-05 DIAGNOSIS — Z4789 Encounter for other orthopedic aftercare: Secondary | ICD-10-CM | POA: Diagnosis not present

## 2020-10-05 DIAGNOSIS — R531 Weakness: Secondary | ICD-10-CM | POA: Diagnosis not present

## 2020-10-05 DIAGNOSIS — M25662 Stiffness of left knee, not elsewhere classified: Secondary | ICD-10-CM | POA: Diagnosis not present

## 2020-10-05 DIAGNOSIS — R2689 Other abnormalities of gait and mobility: Secondary | ICD-10-CM | POA: Diagnosis not present

## 2020-10-05 DIAGNOSIS — M25562 Pain in left knee: Secondary | ICD-10-CM | POA: Diagnosis not present

## 2020-10-05 DIAGNOSIS — Z96652 Presence of left artificial knee joint: Secondary | ICD-10-CM | POA: Diagnosis not present

## 2020-10-07 DIAGNOSIS — M25662 Stiffness of left knee, not elsewhere classified: Secondary | ICD-10-CM | POA: Diagnosis not present

## 2020-10-07 DIAGNOSIS — Z4789 Encounter for other orthopedic aftercare: Secondary | ICD-10-CM | POA: Diagnosis not present

## 2020-10-07 DIAGNOSIS — Z96652 Presence of left artificial knee joint: Secondary | ICD-10-CM | POA: Diagnosis not present

## 2020-10-07 DIAGNOSIS — R531 Weakness: Secondary | ICD-10-CM | POA: Diagnosis not present

## 2020-10-07 DIAGNOSIS — M25562 Pain in left knee: Secondary | ICD-10-CM | POA: Diagnosis not present

## 2020-10-07 DIAGNOSIS — R2689 Other abnormalities of gait and mobility: Secondary | ICD-10-CM | POA: Diagnosis not present

## 2020-10-12 DIAGNOSIS — M25662 Stiffness of left knee, not elsewhere classified: Secondary | ICD-10-CM | POA: Diagnosis not present

## 2020-10-12 DIAGNOSIS — R531 Weakness: Secondary | ICD-10-CM | POA: Diagnosis not present

## 2020-10-12 DIAGNOSIS — M25562 Pain in left knee: Secondary | ICD-10-CM | POA: Diagnosis not present

## 2020-10-12 DIAGNOSIS — Z4789 Encounter for other orthopedic aftercare: Secondary | ICD-10-CM | POA: Diagnosis not present

## 2020-10-12 DIAGNOSIS — R2689 Other abnormalities of gait and mobility: Secondary | ICD-10-CM | POA: Diagnosis not present

## 2020-10-12 DIAGNOSIS — Z96652 Presence of left artificial knee joint: Secondary | ICD-10-CM | POA: Diagnosis not present

## 2020-10-19 DIAGNOSIS — Z96652 Presence of left artificial knee joint: Secondary | ICD-10-CM | POA: Diagnosis not present

## 2020-10-19 DIAGNOSIS — R2689 Other abnormalities of gait and mobility: Secondary | ICD-10-CM | POA: Diagnosis not present

## 2020-10-19 DIAGNOSIS — Z4789 Encounter for other orthopedic aftercare: Secondary | ICD-10-CM | POA: Diagnosis not present

## 2020-10-19 DIAGNOSIS — M25662 Stiffness of left knee, not elsewhere classified: Secondary | ICD-10-CM | POA: Diagnosis not present

## 2020-10-19 DIAGNOSIS — M25562 Pain in left knee: Secondary | ICD-10-CM | POA: Diagnosis not present

## 2020-10-19 DIAGNOSIS — R531 Weakness: Secondary | ICD-10-CM | POA: Diagnosis not present

## 2020-11-04 DIAGNOSIS — H401131 Primary open-angle glaucoma, bilateral, mild stage: Secondary | ICD-10-CM | POA: Diagnosis not present

## 2020-12-08 NOTE — Progress Notes (Addendum)
COVID swab appointment: 12-16-20  COVID Vaccine Completed:  No Date COVID Vaccine completed: Has received booster: COVID vaccine manufacturer: [employer]   Date of COVID positive in last 90 days:  No  PCP - Delman Cheadle, PA-C (office note requested x2) Cardiologist - N/A  Chest x-ray - N/A EKG - 08-11-20 Epic Stress Test - N/A ECHO - N/A Cardiac Cath - N/A Pacemaker/ICD device last checked: Spinal Cord Stimulator:  Sleep Study - N/A CPAP -   Fasting Blood Sugar - N/A Checks Blood Sugar _____ times a day  Blood Thinner Instructions: Aspirin Instructions:  ASA 81 mg.  Per patient to stop one week prior Last Dose:  Activity level:  Can go up a flight of stairs and perform activities of daily living without stopping and without symptoms of chest pain or shortness of breath.    Anesthesia review: BP elevated at PAT 167/100 and on recheck 171/78.  Patient denied chest pain, headache, SOB.  She does not monitor at home.   Patient denies shortness of breath, fever, cough and chest pain at PAT appointment   Patient verbalized understanding of instructions that were given to them at the PAT appointment. Patient was also instructed that they will need to review over the PAT instructions again at home before surgery.

## 2020-12-08 NOTE — Patient Instructions (Addendum)
DUE TO COVID-19 ONLY ONE VISITOR IS ALLOWED TO COME WITH YOU AND STAY IN THE WAITING ROOM ONLY DURING PRE OP AND PROCEDURE.   **NO VISITORS ARE ALLOWED IN THE SHORT STAY AREA OR RECOVERY ROOM!!**  IF YOU WILL BE ADMITTED INTO THE HOSPITAL YOU ARE ALLOWED ONLY TWO SUPPORT PEOPLE DURING VISITATION HOURS ONLY (7 AM -8PM)    Up to two visitors ages 23+ are allowed at one time in a patient's room.  The visitors may rotate out with other people throughout the day.  Additionally, up to two children between the ages of 36 and 52 are allowed and do not count toward the number of allowed visitors.  Children within this age range must be accompanied by an adult visitor.  One adult visitor may remain with the patient overnight and must be in the room by 8 PM.  COVID SWAB TESTING MUST BE COMPLETED ON:  Thursday, 12-16-20 Between the hours of 8 and 3  **MUST PRESENT COMPLETED FORM AT TESTING SITE**    [facility] [place] (backside of the building)  You are not required to quarantine, however you are required to wear a well-fitted mask when you are out and around people not in your household.  Hand Hygiene often Do NOT share personal items Notify your provider if you are in close contact with someone who has COVID or you develop fever 100.4 or greater, new onset of sneezing, cough, sore throat, shortness of breath or body aches.   Your procedure is scheduled on: Monday, 12-20-20   Report to [facility] Main  Entrance     Report to admitting at 7:45 AM   Call this number if you have problems the morning of surgery [phone]   Do not eat food :After Midnight.   May have liquids until 7:30 AM day of surgery  CLEAR LIQUID DIET  Foods Allowed                                                                     Foods Excluded  Water, Black Coffee (no milk/no creamer) and tea, regular and decaf                              liquids that you cannot  Plain Jell-O in any flavor   (No red)                         see through such as: Fruit ices (not with fruit pulp)                                 milk, soups, orange juice  Iced Popsicles (No red)                                    All solid food                             Apple juices Sports drinks like Gatorade (No red)  Lightly seasoned clear broth or consume(fat free) Sugar     Complete one Ensure drink the morning of surgery at  7:30 AM the day of surgery.       The day of surgery:  Drink ONE (1) Pre-Surgery Clear Ensure the morning of surgery. Drink in one sitting. Do not sip.  This drink was given to you during your hospital  pre-op appointment visit. Nothing else to drink after completing the Pre-Surgery Clear Ensure.          If you have questions, please contact your surgeon's office.     Oral Hygiene is also important to reduce your risk of infection.                                    Remember - BRUSH YOUR TEETH THE MORNING OF SURGERY WITH YOUR REGULAR TOOTHPASTE   Do NOT smoke after Midnight  Take these medicines the morning of surgery with A SIP OF WATER: Zyrtec, Omeprazole   Stop all vitamins and herbal supplements a week before surgery             You may not have any metal on your body including hair pins, jewelry, and body piercing             Do not wear make-up, lotions, powders, perfumes or deodorant  Do not wear nail polish including gel and S&S, artificial/acrylic nails, or any other type of covering on natural nails including finger and toenails. If you have artificial nails, gel coating, etc. that needs to be removed by a nail salon please have this removed prior to surgery or surgery may need to be canceled/ delayed if the surgeon/ anesthesia feels like they are unable to be safely monitored.   Do not shave  48 hours prior to surgery.               Do not bring valuables to the hospital. [place].   Contacts, dentures or bridgework may not  be worn into surgery.   Bring small overnight bag day of surgery.   Please read over the following fact sheets you were given: IF YOU HAVE QUESTIONS ABOUT YOUR PRE OP INSTRUCTIONS PLEASE CALL [facility] - Preparing for Surgery Before surgery, you can play an important role.  Because skin is not sterile, your skin needs to be as free of germs as possible.  You can reduce the number of germs on your skin by washing with CHG (chlorahexidine gluconate) soap before surgery.  CHG is an antiseptic cleaner which kills germs and bonds with the skin to continue killing germs even after washing. Please DO NOT use if you have an allergy to CHG or antibacterial soaps.  If your skin becomes reddened/irritated stop using the CHG and inform your nurse when you arrive at Short Stay. Do not shave (including legs and underarms) for at least 48 hours prior to the first CHG shower.  You may shave your face/neck.  Please follow these instructions carefully:  1.  Shower with CHG Soap the night before surgery and the  morning of surgery.  2.  If you choose to wash your hair, wash your hair first as usual with your normal  shampoo.  3.  After you shampoo, rinse your hair and body thoroughly to remove the shampoo.  4.  Use CHG as you would any other liquid soap.  You can apply chg directly to the skin and wash.  Gently with a scrungie or clean washcloth.  5.  Apply the CHG Soap to your body ONLY FROM THE NECK DOWN.   Do   not use on face/ open                           Wound or open sores. Avoid contact with eyes, ears mouth and   genitals (private parts).                       Wash face,  Genitals (private parts) with your normal soap.             6.  Wash thoroughly, paying special attention to the area where your    surgery  will be performed.  7.  Thoroughly rinse your body with warm water from the neck down.  8.  DO NOT shower/wash with your normal soap after using  and rinsing off the CHG Soap.                9.  Pat yourself dry with a clean towel.            10.  Wear clean pajamas.            11.  Place clean sheets on your bed the night of your first shower and do not  sleep with pets. Day of Surgery : Do not apply any lotions/deodorants the morning of surgery.  Please wear clean clothes to the hospital/surgery center.  FAILURE TO FOLLOW THESE INSTRUCTIONS MAY RESULT IN THE CANCELLATION OF YOUR SURGERY  PATIENT SIGNATURE_________________________________  NURSE SIGNATURE__________________________________  ________________________________________________________________________   Adam Phenix  An incentive spirometer is a tool that can help keep your lungs clear and active. This tool measures how well you are filling your lungs with each breath. Taking long deep breaths may help reverse or decrease the chance of developing breathing (pulmonary) problems (especially infection) following: A long period of time when you are unable to move or be active. BEFORE THE PROCEDURE  If the spirometer includes an indicator to show your best effort, your nurse or respiratory therapist will set it to a desired goal. If possible, sit up straight or lean slightly forward. Try not to slouch. Hold the incentive spirometer in an upright position. INSTRUCTIONS FOR USE  Sit on the edge of your bed if possible, or sit up as far as you can in bed or on a chair. Hold the incentive spirometer in an upright position. Breathe out normally. Place the mouthpiece in your mouth and seal your lips tightly around it. Breathe in slowly and as deeply as possible, raising the piston or the ball toward the top of the column. Hold your breath for 3-5 seconds or for as long as possible. Allow the piston or ball to fall to the bottom of the column. Remove the mouthpiece from your mouth and breathe out normally. Rest for a few seconds and repeat Steps 1 through 7 at least 10  times every 1-2 hours when you are awake. Take your time and take a few normal breaths between deep breaths. The spirometer may include an indicator to show your best effort. Use the indicator as a goal to work toward during each repetition. After each set of 10 deep breaths, practice coughing to be sure  your lungs are clear. If you have an incision (the cut made at the time of surgery), support your incision when coughing by placing a pillow or rolled up towels firmly against it. Once you are able to get out of bed, walk around indoors and cough well. You may stop using the incentive spirometer when instructed by your caregiver.  RISKS AND COMPLICATIONS Take your time so you do not get dizzy or light-headed. If you are in pain, you may need to take or ask for pain medication before doing incentive spirometry. It is harder to take a deep breath if you are having pain. AFTER USE Rest and breathe slowly and easily. It can be helpful to keep track of a log of your progress. Your caregiver can provide you with a simple table to help with this. If you are using the spirometer at home, follow these instructions: [place] IF:  You are having difficultly using the spirometer. You have trouble using the spirometer as often as instructed. Your pain medication is not giving enough relief while using the spirometer. You develop fever of 100.5 F (38.1 C) or higher. SEEK IMMEDIATE MEDICAL CARE IF:  You cough up bloody sputum that had not been present before. You develop fever of 102 F (38.9 C) or greater. You develop worsening pain at or near the incision site. MAKE SURE YOU:  Understand these instructions. Will watch your condition. Will get help right away if you are not doing well or get worse. Document Released: 05/01/2006 Document Revised: 03/13/2011 Document Reviewed: 07/02/2006 [facility] Patient Information 2014 [place],  [place].   ________________________________________________________________________

## 2020-12-09 ENCOUNTER — Other Ambulatory Visit: Payer: Self-pay

## 2020-12-09 ENCOUNTER — Encounter (HOSPITAL_COMMUNITY): Payer: Self-pay

## 2020-12-09 ENCOUNTER — Encounter (HOSPITAL_COMMUNITY)
Admission: RE | Admit: 2020-12-09 | Discharge: 2020-12-09 | Disposition: A | Discharge status: Home / Self Care | Payer: Medicare HMO | Source: Ambulatory Visit | Attending: Orthopedic Surgery | Admitting: Orthopedic Surgery

## 2020-12-09 VITALS — BP 171/78 | HR 69 | Temp 98.4°F | Resp 18 | Ht 68.0 in | Wt 213.0 lb

## 2020-12-09 DIAGNOSIS — Z01818 Encounter for other preprocedural examination: Secondary | ICD-10-CM

## 2020-12-09 DIAGNOSIS — Z01812 Encounter for preprocedural laboratory examination: Secondary | ICD-10-CM | POA: Insufficient documentation

## 2020-12-09 DIAGNOSIS — M1711 Unilateral primary osteoarthritis, right knee: Secondary | ICD-10-CM | POA: Insufficient documentation

## 2020-12-09 HISTORY — DX: Unspecified glaucoma: H40.9

## 2020-12-09 LAB — CBC
HCT: 38.9 % (ref 36.0–46.0)
Hemoglobin: 12.3 g/dL (ref 12.0–15.0)
MCH: 29.8 pg (ref 26.0–34.0)
MCHC: 31.6 g/dL (ref 30.0–36.0)
MCV: 94.2 fL (ref 80.0–100.0)
Platelets: 228 10*3/uL (ref 150–400)
RBC: 4.13 MIL/uL (ref 3.87–5.11)
RDW: 13.6 % (ref 11.5–15.5)
WBC: 5.9 10*3/uL (ref 4.0–10.5)
nRBC: 0 % (ref 0.0–0.2)

## 2020-12-09 LAB — COMPREHENSIVE METABOLIC PANEL
ALT: 12 U/L (ref 0–44)
AST: 16 U/L (ref 15–41)
Albumin: 4.1 g/dL (ref 3.5–5.0)
Alkaline Phosphatase: 93 U/L (ref 38–126)
Anion gap: 9 (ref 5–15)
BUN: 18 mg/dL (ref 8–23)
CO2: 26 mmol/L (ref 22–32)
Calcium: 9.6 mg/dL (ref 8.9–10.3)
Chloride: 104 mmol/L (ref 98–111)
Creatinine, Ser: 1.02 mg/dL — ABNORMAL HIGH (ref 0.44–1.00)
GFR, Estimated: 60 mL/min — ABNORMAL LOW (ref >60)
Glucose, Bld: 77 mg/dL (ref 70–99)
Potassium: 4 mmol/L (ref 3.5–5.1)
Sodium: 139 mmol/L (ref 135–145)
Total Bilirubin: 0.5 mg/dL (ref 0.3–1.2)
Total Protein: 8 g/dL (ref 6.5–8.1)

## 2020-12-09 LAB — PROTIME-INR
INR: 1 (ref 0.8–1.2)
Prothrombin Time: 13 seconds (ref 11.4–15.2)

## 2020-12-09 LAB — SURGICAL PCR SCREEN
MRSA, PCR: NEGATIVE
Staphylococcus aureus: NEGATIVE

## 2020-12-16 ENCOUNTER — Other Ambulatory Visit: Payer: Self-pay | Admitting: Orthopedic Surgery

## 2020-12-16 LAB — SARS CORONAVIRUS 2 (TAT 6-24 HRS): SARS Coronavirus 2: NEGATIVE

## 2020-12-18 ENCOUNTER — Ambulatory Visit (HOSPITAL_COMMUNITY): Payer: Medicare HMO

## 2020-12-19 NOTE — H&P (Signed)
TOTAL KNEE ADMISSION H&P  Patient is being admitted for right total knee arthroplasty.  Subjective:  Chief Complaint: Right knee pain.  HPI: Amy Newman, 70 y.o. female has a history of pain and functional disability in the right knee due to arthritis and has failed non-surgical conservative treatments for greater than 12 weeks to include NSAID's and/or analgesics, flexibility and strengthening excercises, and activity modification. Onset of symptoms was gradual, starting  several  years ago with gradually worsening course since that time. The patient noted no past surgery on the right knee.  Patient currently rates pain in the right knee at 7 out of 10 with activity. Patient has worsening of pain with activity and weight bearing, pain that interferes with activities of daily living, pain with passive range of motion, and crepitus. Patient has evidence of periarticular osteophytes and joint space narrowing by imaging studies. There is no active infection.  Patient Active Problem List   Diagnosis Date Noted   Lumbar spinal stenosis 09/29/2020   OA (osteoarthritis) of knee 08/23/2020   Primary osteoarthritis of left knee 08/23/2020   Right foot pain 11/12/2019    Past Medical History:  Diagnosis Date   Arthritis    Cancer ([place])    cervical cancer   Glaucoma    H/O: cesarean section    Hypertension    Lumbar spinal stenosis 09/29/2020   Obese    PONV (postoperative nausea and vomiting)     Past Surgical History:  Procedure Laterality Date   athroscopic knee surgery bilaterall Bilateral    CARPAL TUNNEL RELEASE Right    CESAREAN SECTION     ROTATOR CUFF REPAIR Right    TOTAL KNEE ARTHROPLASTY Left 08/23/2020   Procedure: TOTAL KNEE ARTHROPLASTY;  Surgeon: Gaynelle Arabian, MD;  Location: [facility];  Service: Orthopedics;  Laterality: Left;    Prior to Admission medications   Medication Sig Start Date End Date Taking? Authorizing Provider  aspirin EC 81 MG tablet Take 81 mg  by mouth daily. Swallow whole.   Yes [provider]  atenolol (TENORMIN) 50 MG tablet Take 50 mg by mouth at bedtime. 08/05/19  Yes [provider]  atorvastatin (LIPITOR) 20 MG tablet Take 20 mg by mouth at bedtime. 08/02/19  Yes [provider]  brimonidine (ALPHAGAN) 0.2 % ophthalmic solution Place 1 drop into both eyes 2 (two) times daily. 07/13/20  Yes [provider]  Calcium Citrate (CITRACAL PO) Take 1 tablet by mouth daily.   Yes [provider]  cetirizine (ZYRTEC) 10 MG tablet Take 10 mg by mouth daily as needed for allergies.   Yes [provider]  ibuprofen (ADVIL) 800 MG tablet Take 800 mg by mouth in the morning and at bedtime.   Yes [provider]  latanoprost (XALATAN) 0.005 % ophthalmic solution Place 1 drop into both eyes at bedtime. 07/13/20  Yes [provider]  meclizine (ANTIVERT) 25 MG tablet Take 25 mg by mouth every 6 (six) hours as needed for dizziness.   Yes [provider]  Multiple Vitamin (MULTIVITAMIN WITH MINERALS) TABS tablet Take 1 tablet by mouth in the morning.   Yes [provider]  omeprazole (PRILOSEC) 20 MG capsule Take 20 mg by mouth daily before breakfast.   Yes [provider]  gabapentin (NEURONTIN) 300 MG capsule Take a 300 mg capsule three times a day for two weeks following surgery.Then take a 300 mg capsule two times a day for two weeks. Then take a 300 mg  capsule once a day for two weeks. Then discontinue. Patient not taking: Reported on 12/02/2020 08/24/20   Theresa Duty L, PA    Allergies  Allergen Reactions   Hydrocodone Nausea Only   Tramadol Nausea Only   Penicillins Hives, Swelling and Rash    Tolerated Ancef 2 grams 08-23-20    Social History   Socioeconomic History   Marital status: Divorced    Spouse name: Not on file   Number of children: Not on file   Years of education: Not on file   Highest education level: Not on file   Occupational History   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[employer]  Tobacco Use   Smoking status: Never    Passive exposure: Never   Smokeless tobacco: Never  Vaping Use   Vaping Use: Never used  Substance and Sexual Activity   Alcohol use: Never   Drug use: Never   Sexual activity: Not on file  Other Topics Concern   Not on file  Social History Narrative   Lives alone   Right Handed   Drinks 1 can soda on Sunday   Social Determinants of Health   Financial Resource Strain: Not on file  Food Insecurity: Not on file  Transportation Needs: Not on file  Physical Activity: Not on file  Stress: Not on file  Social Connections: Not on file  Intimate Partner Violence: Not on file    Tobacco Use: Low Risk    Smoking Tobacco Use: Never   Smokeless Tobacco Use: Never   Passive Exposure: Never   Social History   Substance and Sexual Activity  Alcohol Use Never    Family History  Problem Relation Age of Onset   Stroke Mother    Diabetes Mother    Cancer Father    Cancer Sister        breast   Diabetes Brother    Peripheral Artery Disease Brother     ROS: Constitutional: no fever, no chills, no night sweats, no significant weight loss Cardiovascular: no chest pain, no palpitations Respiratory: no cough, no shortness of breath, No COPD Gastrointestinal: no vomiting, no nausea Musculoskeletal: no swelling in Joints, Joint Pain Neurologic: no numbness, no tingling, no difficulty with balance   Objective:  Physical Exam: Well nourished and well developed.  General: Alert and oriented x3, cooperative and pleasant, no acute distress.  Head: normocephalic, atraumatic, neck supple.  Eyes: EOMI.  Respiratory: breath sounds clear in all fields, no wheezing, rales, or rhonchi.  Cardiovascular: Regular rate and rhythm, no murmurs, gallops or rubs.  Abdomen: non-tender to palpation and soft, normoactive bowel sounds.  Musculoskeletal:     The patient has a  significantly antalgic gait pattern due to the bilateral varus deformities.     Bilateral Hip Exam:   The range of motion: normal without discomfort.     Right Knee Exam:   No effusion present. No swelling present.   The range of motion is: 5 to 90 degrees.   Moderate crepitus on range of motion of the knee.   Positive medial greater than lateral joint line tenderness.   The knee is stable.         Calves soft and nontender. Motor function intact in LE. Strength 5/5 LE bilaterally.  Neuro: Distal pulses 2+. Sensation to light touch intact in LE.       Vital signs in last 24 hours:    Imaging Review Radiographs- AP and lateral of the bilateral knees from  06/03/2020 demonstrate severe end-stage arthritis in the bilateral knees in the medial and patellofemoral compartments with varus deformity and severe osteophyte formation throughout the bilateral knees.  Assessment/Plan:  End stage arthritis, right knee   The patient history, physical examination, clinical judgment of the provider and imaging studies are consistent with end stage degenerative joint disease of the right knee and total knee arthroplasty is deemed medically necessary. The treatment options including medical management, injection therapy arthroscopy and arthroplasty were discussed at length. The risks and benefits of total knee arthroplasty were presented and reviewed. The risks due to aseptic loosening, infection, stiffness, patella tracking problems, thromboembolic complications and other imponderables were discussed. The patient acknowledged the explanation, agreed to proceed with the plan and consent was signed. Patient is being admitted for inpatient treatment for surgery, pain control, PT, OT, prophylactic antibiotics, VTE prophylaxis, progressive ambulation and ADLs and discharge planning. The patient is planning to be discharged  home .   Patient's anticipated LOS is less than 2 midnights, meeting these  requirements: - Younger than 50 - Lives within 1 hour of care - Has a competent adult at home to recover with post-op recover - NO history of  - Chronic pain requiring opiods  - Diabetes  - Coronary Artery Disease  - Heart failure  - Heart attack  - Stroke  - DVT/VTE  - Cardiac arrhythmia  - Respiratory Failure/COPD  - Renal failure  - Anemia  - Advanced Liver disease    Therapy Plans: [facility] Disposition: Home with her son Planned DVT Prophylaxis: Aspirin 325mg   DME Needed: None PCP: Delman Cheadle, PA-C (clearance received) TXA: IV Allergies: PCN (rash/swelling) Anesthesia Concerns: None BMI: 37.2 Last HgbA1c: n/a  Pharmacy: [facility] in [place]  Other: 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  - Patient was instructed on what medications to stop prior to surgery. - Follow-up visit in 2 weeks with Dr. Wynelle Link - Begin physical therapy following surgery - Pre-operative lab work as pre-surgical testing - Prescriptions will be provided in hospital at time of discharge  Fenton Foy, [facility], PA-C Orthopedic Surgery [facility]

## 2020-12-20 ENCOUNTER — Ambulatory Visit (HOSPITAL_COMMUNITY): Payer: Medicare HMO | Admitting: Anesthesiology

## 2020-12-20 ENCOUNTER — Observation Stay (HOSPITAL_COMMUNITY)
Admission: RE | Admit: 2020-12-20 | Discharge: 2020-12-21 | Disposition: A | Discharge status: Home / Self Care | Payer: Medicare HMO | Source: Ambulatory Visit | Attending: Orthopedic Surgery | Admitting: Orthopedic Surgery

## 2020-12-20 ENCOUNTER — Encounter (HOSPITAL_COMMUNITY)
Admission: RE | Disposition: A | Discharge status: Home / Self Care | Payer: Self-pay | Source: Ambulatory Visit | Attending: Orthopedic Surgery

## 2020-12-20 ENCOUNTER — Encounter (HOSPITAL_COMMUNITY): Payer: Self-pay | Admitting: Orthopedic Surgery

## 2020-12-20 ENCOUNTER — Other Ambulatory Visit: Payer: Self-pay

## 2020-12-20 ENCOUNTER — Ambulatory Visit (HOSPITAL_COMMUNITY): Payer: Medicare HMO | Admitting: Physician Assistant

## 2020-12-20 DIAGNOSIS — Z96651 Presence of right artificial knee joint: Secondary | ICD-10-CM | POA: Diagnosis not present

## 2020-12-20 DIAGNOSIS — Z79899 Other long term (current) drug therapy: Secondary | ICD-10-CM | POA: Diagnosis not present

## 2020-12-20 DIAGNOSIS — M1711 Unilateral primary osteoarthritis, right knee: Secondary | ICD-10-CM | POA: Diagnosis not present

## 2020-12-20 DIAGNOSIS — I10 Essential (primary) hypertension: Secondary | ICD-10-CM | POA: Insufficient documentation

## 2020-12-20 DIAGNOSIS — Z8541 Personal history of malignant neoplasm of cervix uteri: Secondary | ICD-10-CM | POA: Diagnosis not present

## 2020-12-20 DIAGNOSIS — G8918 Other acute postprocedural pain: Secondary | ICD-10-CM | POA: Diagnosis not present

## 2020-12-20 DIAGNOSIS — Z7982 Long term (current) use of aspirin: Secondary | ICD-10-CM | POA: Insufficient documentation

## 2020-12-20 HISTORY — PX: TOTAL KNEE ARTHROPLASTY: SHX125

## 2020-12-20 SURGERY — ARTHROPLASTY, KNEE, TOTAL
Anesthesia: General | Site: Knee | Laterality: Right

## 2020-12-20 MED ORDER — CEFAZOLIN SODIUM-DEXTROSE 2-4 GM/100ML-% IV SOLN
2.0000 g | Freq: Four times a day (QID) | INTRAVENOUS | Status: AC
  Administered 2020-12-20 (×2): 2 g via INTRAVENOUS
  Filled 2020-12-20 (×2): qty 100

## 2020-12-20 MED ORDER — 0.9 % SODIUM CHLORIDE (POUR BTL) OPTIME
TOPICAL | Status: DC | PRN
  Administered 2020-12-20: 11:00:00 1000 mL

## 2020-12-20 MED ORDER — ONDANSETRON HCL 4 MG/2ML IJ SOLN
INTRAMUSCULAR | Status: DC | PRN
  Administered 2020-12-20: 4 mg via INTRAVENOUS

## 2020-12-20 MED ORDER — DEXAMETHASONE SODIUM PHOSPHATE 10 MG/ML IJ SOLN
8.0000 mg | Freq: Once | INTRAMUSCULAR | Status: AC
  Administered 2020-12-20: 11:00:00 8 mg via INTRAVENOUS

## 2020-12-20 MED ORDER — MIDAZOLAM HCL 2 MG/2ML IJ SOLN
1.0000 mg | INTRAMUSCULAR | Status: AC
  Administered 2020-12-20: 10:00:00 2 mg via INTRAVENOUS
  Filled 2020-12-20: qty 2

## 2020-12-20 MED ORDER — METOCLOPRAMIDE HCL 5 MG/ML IJ SOLN
5.0000 mg | Freq: Three times a day (TID) | INTRAMUSCULAR | Status: DC | PRN
  Administered 2020-12-20: 17:00:00 10 mg via INTRAVENOUS
  Filled 2020-12-20: qty 2

## 2020-12-20 MED ORDER — PROPOFOL 10 MG/ML IV BOLUS
INTRAVENOUS | Status: DC | PRN
  Administered 2020-12-20: 30 mg via INTRAVENOUS
  Administered 2020-12-20: 20 mg via INTRAVENOUS

## 2020-12-20 MED ORDER — PROPOFOL 500 MG/50ML IV EMUL
INTRAVENOUS | Status: AC
  Filled 2020-12-20: qty 50

## 2020-12-20 MED ORDER — DIPHENHYDRAMINE HCL 12.5 MG/5ML PO ELIX: 12.5000 mg | ORAL_SOLUTION | ORAL | Status: DC | PRN

## 2020-12-20 MED ORDER — BRIMONIDINE TARTRATE 0.2 % OP SOLN
1.0000 [drp] | Freq: Two times a day (BID) | OPHTHALMIC | Status: DC
  Administered 2020-12-20 – 2020-12-21 (×2): 1 [drp] via OPHTHALMIC
  Filled 2020-12-20 (×2): qty 5

## 2020-12-20 MED ORDER — FENTANYL CITRATE PF 50 MCG/ML IJ SOSY: 25.0000 ug | PREFILLED_SYRINGE | INTRAMUSCULAR | Status: DC | PRN

## 2020-12-20 MED ORDER — PHENYLEPHRINE HCL-NACL 20-0.9 MG/250ML-% IV SOLN
INTRAVENOUS | Status: DC | PRN
  Administered 2020-12-20: 40 ug/min via INTRAVENOUS

## 2020-12-20 MED ORDER — BUPIVACAINE LIPOSOME 1.3 % IJ SUSP
INTRAMUSCULAR | Status: AC
  Filled 2020-12-20: qty 20

## 2020-12-20 MED ORDER — AMISULPRIDE (ANTIEMETIC) 5 MG/2ML IV SOLN: 10.0000 mg | Freq: Once | INTRAVENOUS | Status: DC | PRN

## 2020-12-20 MED ORDER — LACTATED RINGERS IV SOLN: INTRAVENOUS | Status: DC

## 2020-12-20 MED ORDER — SODIUM CHLORIDE (PF) 0.9 % IJ SOLN
INTRAMUSCULAR | Status: DC | PRN
  Administered 2020-12-20: 60 mL

## 2020-12-20 MED ORDER — OXYCODONE HCL 5 MG PO TABS: 5.0000 mg | ORAL_TABLET | Freq: Once | ORAL | Status: DC | PRN

## 2020-12-20 MED ORDER — ACETAMINOPHEN 10 MG/ML IV SOLN
1000.0000 mg | Freq: Four times a day (QID) | INTRAVENOUS | Status: DC
  Filled 2020-12-20 (×2): qty 100

## 2020-12-20 MED ORDER — ORAL CARE MOUTH RINSE: 15.0000 mL | Freq: Once | OROMUCOSAL | Status: AC

## 2020-12-20 MED ORDER — PHENOL 1.4 % MT LIQD: 1.0000 | OROMUCOSAL | Status: DC | PRN

## 2020-12-20 MED ORDER — FLEET ENEMA 7-19 GM/118ML RE ENEM: 1.0000 | ENEMA | Freq: Once | RECTAL | Status: DC | PRN

## 2020-12-20 MED ORDER — FENTANYL CITRATE PF 50 MCG/ML IJ SOSY
50.0000 ug | PREFILLED_SYRINGE | INTRAMUSCULAR | Status: AC
  Administered 2020-12-20: 10:00:00 100 ug via INTRAVENOUS
  Filled 2020-12-20: qty 2

## 2020-12-20 MED ORDER — ACETAMINOPHEN 10 MG/ML IV SOLN
INTRAVENOUS | Status: DC | PRN
  Administered 2020-12-20: 1000 mg via INTRAVENOUS

## 2020-12-20 MED ORDER — POLYETHYLENE GLYCOL 3350 17 G PO PACK: 17.0000 g | PACK | Freq: Every day | ORAL | Status: DC | PRN

## 2020-12-20 MED ORDER — BUPIVACAINE LIPOSOME 1.3 % IJ SUSP
INTRAMUSCULAR | Status: DC | PRN
  Administered 2020-12-20: 20 mL

## 2020-12-20 MED ORDER — METHOCARBAMOL 500 MG IVPB - SIMPLE MED
500.0000 mg | Freq: Four times a day (QID) | INTRAVENOUS | Status: DC | PRN
  Filled 2020-12-20: qty 50

## 2020-12-20 MED ORDER — OXYCODONE HCL 5 MG PO TABS: 5.0000 mg | ORAL_TABLET | ORAL | Status: DC | PRN

## 2020-12-20 MED ORDER — MENTHOL 3 MG MT LOZG: 1.0000 | LOZENGE | OROMUCOSAL | Status: DC | PRN

## 2020-12-20 MED ORDER — PROPOFOL 1000 MG/100ML IV EMUL
INTRAVENOUS | Status: AC
  Filled 2020-12-20: qty 200

## 2020-12-20 MED ORDER — ONDANSETRON HCL 4 MG/2ML IJ SOLN
INTRAMUSCULAR | Status: AC
  Filled 2020-12-20: qty 4

## 2020-12-20 MED ORDER — OXYCODONE HCL 5 MG PO TABS: 10.0000 mg | ORAL_TABLET | ORAL | Status: DC | PRN

## 2020-12-20 MED ORDER — ONDANSETRON HCL 4 MG/2ML IJ SOLN: 4.0000 mg | Freq: Four times a day (QID) | INTRAMUSCULAR | Status: DC | PRN

## 2020-12-20 MED ORDER — BUPIVACAINE LIPOSOME 1.3 % IJ SUSP: 20.0000 mL | Freq: Once | INTRAMUSCULAR | Status: DC

## 2020-12-20 MED ORDER — ACETAMINOPHEN 10 MG/ML IV SOLN
1000.0000 mg | Freq: Four times a day (QID) | INTRAVENOUS | Status: AC
  Administered 2020-12-20 – 2020-12-21 (×4): 1000 mg via INTRAVENOUS
  Filled 2020-12-20 (×4): qty 100

## 2020-12-20 MED ORDER — VANCOMYCIN HCL IN DEXTROSE 1-5 GM/200ML-% IV SOLN: 1000.0000 mg | INTRAVENOUS | Status: DC

## 2020-12-20 MED ORDER — ROPIVACAINE HCL 5 MG/ML IJ SOLN
INTRAMUSCULAR | Status: DC | PRN
  Administered 2020-12-20: 30 mL via PERINEURAL

## 2020-12-20 MED ORDER — METHOCARBAMOL 500 MG PO TABS: 500.0000 mg | ORAL_TABLET | Freq: Four times a day (QID) | ORAL | Status: DC | PRN

## 2020-12-20 MED ORDER — SODIUM CHLORIDE 0.9 % IR SOLN
Status: DC | PRN
  Administered 2020-12-20: 1000 mL

## 2020-12-20 MED ORDER — POVIDONE-IODINE 10 % EX SWAB
2.0000 "application " | Freq: Once | CUTANEOUS | Status: AC
  Administered 2020-12-20: 2 via TOPICAL

## 2020-12-20 MED ORDER — ATORVASTATIN CALCIUM 20 MG PO TABS: 20.0000 mg | ORAL_TABLET | Freq: Every day | ORAL | Status: DC

## 2020-12-20 MED ORDER — DEXAMETHASONE SODIUM PHOSPHATE 10 MG/ML IJ SOLN
INTRAMUSCULAR | Status: AC
  Filled 2020-12-20: qty 2

## 2020-12-20 MED ORDER — PANTOPRAZOLE SODIUM 40 MG PO TBEC
40.0000 mg | DELAYED_RELEASE_TABLET | Freq: Every day | ORAL | Status: DC
  Administered 2020-12-21: 08:00:00 40 mg via ORAL
  Filled 2020-12-20: qty 1

## 2020-12-20 MED ORDER — ONDANSETRON HCL 4 MG PO TABS: 4.0000 mg | ORAL_TABLET | Freq: Four times a day (QID) | ORAL | Status: DC | PRN

## 2020-12-20 MED ORDER — ACETAMINOPHEN 500 MG PO TABS: 1000.0000 mg | ORAL_TABLET | Freq: Four times a day (QID) | ORAL | Status: DC

## 2020-12-20 MED ORDER — METOCLOPRAMIDE HCL 5 MG PO TABS: 5.0000 mg | ORAL_TABLET | Freq: Three times a day (TID) | ORAL | Status: DC | PRN

## 2020-12-20 MED ORDER — SODIUM CHLORIDE 0.9 % IV SOLN: INTRAVENOUS | Status: DC

## 2020-12-20 MED ORDER — TRANEXAMIC ACID-NACL 1000-0.7 MG/100ML-% IV SOLN
1000.0000 mg | INTRAVENOUS | Status: AC
  Administered 2020-12-20: 11:00:00 1000 mg via INTRAVENOUS
  Filled 2020-12-20: qty 100

## 2020-12-20 MED ORDER — ASPIRIN EC 325 MG PO TBEC
325.0000 mg | DELAYED_RELEASE_TABLET | Freq: Two times a day (BID) | ORAL | Status: DC
  Administered 2020-12-21: 08:00:00 325 mg via ORAL
  Filled 2020-12-20: qty 1

## 2020-12-20 MED ORDER — PROPOFOL 500 MG/50ML IV EMUL
INTRAVENOUS | Status: DC | PRN
  Administered 2020-12-20: 70 ug/kg/min via INTRAVENOUS

## 2020-12-20 MED ORDER — ACETAMINOPHEN 10 MG/ML IV SOLN
INTRAVENOUS | Status: AC
  Filled 2020-12-20: qty 100

## 2020-12-20 MED ORDER — BUPIVACAINE IN DEXTROSE 0.75-8.25 % IT SOLN
INTRATHECAL | Status: DC | PRN
  Administered 2020-12-20: 1.6 mL via INTRATHECAL

## 2020-12-20 MED ORDER — CEFAZOLIN SODIUM-DEXTROSE 2-4 GM/100ML-% IV SOLN
2.0000 g | Freq: Once | INTRAVENOUS | Status: AC
  Administered 2020-12-20: 11:00:00 2 g via INTRAVENOUS
  Filled 2020-12-20: qty 100

## 2020-12-20 MED ORDER — DEXAMETHASONE SODIUM PHOSPHATE 10 MG/ML IJ SOLN
10.0000 mg | Freq: Once | INTRAMUSCULAR | Status: AC
  Administered 2020-12-21: 08:00:00 10 mg via INTRAVENOUS
  Filled 2020-12-20: qty 1

## 2020-12-20 MED ORDER — ATENOLOL 50 MG PO TABS: 50.0000 mg | ORAL_TABLET | Freq: Every day | ORAL | Status: DC

## 2020-12-20 MED ORDER — DOCUSATE SODIUM 100 MG PO CAPS
100.0000 mg | ORAL_CAPSULE | Freq: Two times a day (BID) | ORAL | Status: DC
  Administered 2020-12-20 – 2020-12-21 (×2): 100 mg via ORAL
  Filled 2020-12-20 (×2): qty 1

## 2020-12-20 MED ORDER — OXYCODONE HCL 5 MG/5ML PO SOLN: 5.0000 mg | Freq: Once | ORAL | Status: DC | PRN

## 2020-12-20 MED ORDER — GABAPENTIN 300 MG PO CAPS
300.0000 mg | ORAL_CAPSULE | Freq: Three times a day (TID) | ORAL | Status: DC
  Administered 2020-12-20 – 2020-12-21 (×3): 300 mg via ORAL
  Filled 2020-12-20 (×3): qty 1

## 2020-12-20 MED ORDER — LATANOPROST 0.005 % OP SOLN
1.0000 [drp] | Freq: Every day | OPHTHALMIC | Status: DC
Start: 2020-12-20 — End: 2020-12-21
  Administered 2020-12-20: 22:00:00 1 [drp] via OPHTHALMIC
  Filled 2020-12-20: qty 2.5

## 2020-12-20 MED ORDER — CHLORHEXIDINE GLUCONATE 0.12 % MT SOLN
15.0000 mL | Freq: Once | OROMUCOSAL | Status: AC
  Administered 2020-12-20: 09:00:00 15 mL via OROMUCOSAL

## 2020-12-20 MED ORDER — SODIUM CHLORIDE (PF) 0.9 % IJ SOLN
INTRAMUSCULAR | Status: AC
  Filled 2020-12-20: qty 10

## 2020-12-20 MED ORDER — BISACODYL 10 MG RE SUPP: 10.0000 mg | Freq: Every day | RECTAL | Status: DC | PRN

## 2020-12-20 MED ORDER — ONDANSETRON HCL 4 MG/2ML IJ SOLN: 4.0000 mg | Freq: Once | INTRAMUSCULAR | Status: DC | PRN

## 2020-12-20 MED ORDER — STERILE WATER FOR IRRIGATION IR SOLN
Status: DC | PRN
  Administered 2020-12-20: 2000 mL

## 2020-12-20 SURGICAL SUPPLY — 60 items
ATTUNE MED DOME PAT 38 KNEE (Knees) ×1 IMPLANT
ATTUNE MED DOME PAT 38MM KNEE (Knees) ×1 IMPLANT
ATTUNE PS FEM RT SZ 6 CEM KNEE (Femur) ×2 IMPLANT
ATTUNE PSRP INSR SZ6 12 KNEE (Insert) ×1 IMPLANT
ATTUNE PSRP INSR SZ6 12MM KNEE (Insert) ×1 IMPLANT
BAG COUNTER SPONGE SURGICOUNT (BAG) IMPLANT
BAG SPEC THK2 15X12 ZIP CLS (MISCELLANEOUS) ×1
BAG SPNG CNTER NS LX DISP (BAG)
BAG SURGICOUNT SPONGE COUNTING (BAG)
BAG ZIPLOCK 12X15 (MISCELLANEOUS) ×3 IMPLANT
BASE TIBIAL ROT PLAT SZ 5 KNEE (Knees) IMPLANT
BLADE SAG 18X100X1.27 (BLADE) ×3 IMPLANT
BLADE SAW SGTL 11.0X1.19X90.0M (BLADE) ×3 IMPLANT
BNDG ELASTIC 6X5.8 VLCR STR LF (GAUZE/BANDAGES/DRESSINGS) ×3 IMPLANT
BOWL SMART MIX CTS (DISPOSABLE) ×3 IMPLANT
BSPLAT TIB 5 CMNT ROT PLAT STR (Knees) ×1 IMPLANT
CEMENT HV SMART SET (Cement) ×6 IMPLANT
CLOSURE STERI-STRIP 1/2X4 (GAUZE/BANDAGES/DRESSINGS) ×1
CLOSURE WOUND 1/2 X4 (GAUZE/BANDAGES/DRESSINGS) ×2
CLSR STERI-STRIP ANTIMIC 1/2X4 (GAUZE/BANDAGES/DRESSINGS) ×1 IMPLANT
COVER SURGICAL LIGHT HANDLE (MISCELLANEOUS) ×3 IMPLANT
CUFF TOURN SGL QUICK 34 (TOURNIQUET CUFF) ×3
CUFF TRNQT CYL 34X4.125X (TOURNIQUET CUFF) ×1 IMPLANT
DECANTER SPIKE VIAL GLASS SM (MISCELLANEOUS) ×3 IMPLANT
DRAPE INCISE IOBAN 66X45 STRL (DRAPES) ×3 IMPLANT
DRAPE U-SHAPE 47X51 STRL (DRAPES) ×3 IMPLANT
DRSG AQUACEL AG ADV 3.5X10 (GAUZE/BANDAGES/DRESSINGS) ×3 IMPLANT
DURAPREP 26ML APPLICATOR (WOUND CARE) ×3 IMPLANT
ELECT REM PT RETURN 15FT ADLT (MISCELLANEOUS) ×3 IMPLANT
GLOVE SRG 8 PF TXTR STRL LF DI (GLOVE) ×1 IMPLANT
GLOVE SURG ENC MOIS LTX SZ6.5 (GLOVE) ×3 IMPLANT
GLOVE SURG ENC MOIS LTX SZ8 (GLOVE) ×6 IMPLANT
GLOVE SURG UNDER POLY LF SZ7 (GLOVE) ×3 IMPLANT
GLOVE SURG UNDER POLY LF SZ8 (GLOVE) ×3
GLOVE SURG UNDER POLY LF SZ8.5 (GLOVE) ×3 IMPLANT
GOWN STRL REUS W/TWL LRG LVL3 (GOWN DISPOSABLE) ×6 IMPLANT
GOWN STRL REUS W/TWL XL LVL3 (GOWN DISPOSABLE) ×3 IMPLANT
HANDPIECE INTERPULSE COAX TIP (DISPOSABLE) ×3
HOLDER FOLEY CATH W/STRAP (MISCELLANEOUS) IMPLANT
IMMOBILIZER KNEE 20 (SOFTGOODS) ×3
IMMOBILIZER KNEE 20 THIGH 36 (SOFTGOODS) ×1 IMPLANT
KIT TURNOVER KIT A (KITS) IMPLANT
MANIFOLD NEPTUNE II (INSTRUMENTS) ×3 IMPLANT
NS IRRIG 1000ML POUR BTL (IV SOLUTION) ×3 IMPLANT
PACK TOTAL KNEE CUSTOM (KITS) ×3 IMPLANT
PADDING CAST COTTON 6X4 STRL (CAST SUPPLIES) ×4 IMPLANT
PROTECTOR NERVE ULNAR (MISCELLANEOUS) ×3 IMPLANT
SET HNDPC FAN SPRY TIP SCT (DISPOSABLE) ×1 IMPLANT
STRIP CLOSURE SKIN 1/2X4 (GAUZE/BANDAGES/DRESSINGS) ×4 IMPLANT
SUT ETHIBOND NAB CT1 #1 30IN (SUTURE) ×2 IMPLANT
SUT MNCRL AB 4-0 PS2 18 (SUTURE) ×3 IMPLANT
SUT STRATAFIX 0 PDS 27 VIOLET (SUTURE) ×3
SUT VIC AB 2-0 CT1 27 (SUTURE) ×9
SUT VIC AB 2-0 CT1 TAPERPNT 27 (SUTURE) ×3 IMPLANT
SUTURE STRATFX 0 PDS 27 VIOLET (SUTURE) ×1 IMPLANT
TIBIAL BASE ROT PLAT SZ 5 KNEE (Knees) ×3 IMPLANT
TRAY FOLEY MTR SLVR 16FR STAT (SET/KITS/TRAYS/PACK) ×3 IMPLANT
TUBE SUCTION HIGH CAP CLEAR NV (SUCTIONS) ×3 IMPLANT
WATER STERILE IRR 1000ML POUR (IV SOLUTION) ×6 IMPLANT
WRAP KNEE MAXI GEL POST OP (GAUZE/BANDAGES/DRESSINGS) ×3 IMPLANT

## 2020-12-20 NOTE — Progress Notes (Signed)
AssistedDr. Carolyn Witman with right, ultrasound guided, adductor canal block. Side rails up, monitors on throughout procedure. See vital signs in flow sheet. Tolerated Procedure well.  

## 2020-12-20 NOTE — Discharge Instructions (Signed)
Amy Arabian, MD Total Joint Specialist [facility] [address], [address] [place], [place] [address] [phone]  TOTAL KNEE REPLACEMENT POSTOPERATIVE DIRECTIONS    Knee Rehabilitation, Guidelines Following Surgery  Results after knee surgery are often greatly improved when you follow the exercise, range of motion and muscle strengthening exercises prescribed by your doctor. Safety measures are also important to protect the knee from further injury. If any of these exercises cause you to have increased pain or swelling in your knee joint, decrease the amount until you are comfortable again and slowly increase them. If you have problems or questions, call your caregiver or physical therapist for advice.   BLOOD CLOT PREVENTION Take a 325 mg Aspirin two times a day for three weeks following surgery. Then resume one 81 mg aspirin once a day. You may resume your vitamins/supplements upon discharge from the hospital. Do not take any NSAIDs (Advil, Aleve, Ibuprofen, Meloxicam, etc.) until you have discontinued the 325 mg Aspirin.  HOME CARE INSTRUCTIONS  Remove items at home which could result in a fall. This includes throw rugs or furniture in walking pathways.  ICE to the affected knee as much as tolerated. Icing helps control swelling. If the swelling is well controlled you will be more comfortable and rehab easier. Continue to use ice on the knee for pain and swelling from surgery. You may notice swelling that will progress down to the foot and ankle. This is normal after surgery. Elevate the leg when you are not up walking on it.    Continue to use the breathing machine which will help keep your temperature down. It is common for your temperature to cycle up and down following surgery, especially at night when you are not up moving around and exerting yourself. The breathing machine keeps your lungs expanded and your temperature down. Do not place pillow under the  operative knee, focus on keeping the knee straight while resting  DIET You may resume your previous home diet once you are discharged from the hospital.  DRESSING / WOUND CARE / SHOWERING Keep your bulky bandage on for 2 days. On the third post-operative day you may remove the Ace bandage and gauze. There is a waterproof adhesive bandage on your skin which will stay in place until your first follow-up appointment. Once you remove this you will not need to place another bandage You may begin showering 3 days following surgery, but do not submerge the incision under water.  ACTIVITY For the first 5 days, the key is rest and control of pain and swelling Do your home exercises twice a day starting on post-operative day 3. On the days you go to physical therapy, just do the home exercises once that day. You should rest, ice and elevate the leg for 50 minutes out of every hour. Get up and walk/stretch for 10 minutes per hour. After 5 days you can increase your activity slowly as tolerated. Walk with your walker as instructed. Use the walker until you are comfortable transitioning to a cane. Walk with the cane in the opposite hand of the operative leg. You may discontinue the cane once you are comfortable and walking steadily. Avoid periods of inactivity such as sitting longer than an hour when not asleep. This helps prevent blood clots.  You may discontinue the knee immobilizer once you are able to perform a straight leg raise while lying down. You may resume a sexual relationship in one month or when given the OK by  your doctor.  You may return to work once you are cleared by your doctor.  Do not drive a car for 6 weeks or until released by your surgeon.  Do not drive while taking narcotics.  TED HOSE STOCKINGS Wear the elastic stockings on both legs for three weeks following surgery during the day. You may remove them at night for sleeping.  WEIGHT BEARING Weight bearing as tolerated with assist  device (walker, cane, etc) as directed, use it as long as suggested by your surgeon or therapist, typically at least 4-6 weeks.  POSTOPERATIVE CONSTIPATION PROTOCOL Constipation - defined medically as fewer than three stools per week and severe constipation as less than one stool per week.  One of the most common issues patients have following surgery is constipation.  Even if you have a regular bowel pattern at home, your normal regimen is likely to be disrupted due to multiple reasons following surgery.  Combination of anesthesia, postoperative narcotics, change in appetite and fluid intake all can affect your bowels.  In order to avoid complications following surgery, here are some recommendations in order to help you during your recovery period.  Colace (docusate) - Pick up an over-the-counter form of Colace or another stool softener and take twice a day as long as you are requiring postoperative pain medications.  Take with a full glass of water daily.  If you experience loose stools or diarrhea, hold the colace until you stool forms back up. If your symptoms do not get better within 1 week or if they get worse, check with your doctor. Dulcolax (bisacodyl) - Pick up over-the-counter and take as directed by the product packaging as needed to assist with the movement of your bowels.  Take with a full glass of water.  Use this product as needed if not relieved by Colace only.  MiraLax (polyethylene glycol) - Pick up over-the-counter to have on hand. MiraLax is a solution that will increase the amount of water in your bowels to assist with bowel movements.  Take as directed and can mix with a glass of water, juice, soda, coffee, or tea. Take if you go more than two days without a movement. Do not use MiraLax more than once per day. Call your doctor if you are still constipated or irregular after using this medication for 7 days in a row.  If you continue to have problems with postoperative constipation,  please contact the office for further assistance and recommendations.  If you experience "the worst abdominal pain ever" or develop nausea or vomiting, please contact the office immediatly for further recommendations for treatment.  ITCHING If you experience itching with your medications, try taking only a single pain pill, or even half a pain pill at a time.  You can also use Benadryl over the counter for itching or also to help with sleep.   MEDICATIONS See your medication summary on the After Visit Summary that the nursing staff will review with you prior to discharge.  You may have some home medications which will be placed on hold until you complete the course of blood thinner medication.  It is important for you to complete the blood thinner medication as prescribed by your surgeon.  Continue your approved medications as instructed at time of discharge.  PRECAUTIONS If you experience chest pain or shortness of breath - call 911 immediately for transfer to the hospital emergency department.  If you develop a fever greater that 101 F, purulent drainage from wound, increased redness  or drainage from wound, foul odor from the wound/dressing, or calf pain - CONTACT YOUR SURGEON.                                                   FOLLOW-UP APPOINTMENTS Make sure you keep all of your appointments after your operation with your surgeon and caregivers. You should call the office at the above phone number and make an appointment for approximately two weeks after the date of your surgery or on the date instructed by your surgeon outlined in the "After Visit Summary".  RANGE OF MOTION AND STRENGTHENING EXERCISES  Rehabilitation of the knee is important following a knee injury or an operation. After just a few days of immobilization, the muscles of the thigh which control the knee become weakened and shrink (atrophy). Knee exercises are designed to build up the tone and strength of the thigh muscles and to  improve knee motion. Often times heat used for twenty to thirty minutes before working out will loosen up your tissues and help with improving the range of motion but do not use heat for the first two weeks following surgery. These exercises can be done on a training (exercise) mat, on the floor, on a table or on a bed. Use what ever works the best and is most comfortable for you Knee exercises include:  Leg Lifts - While your knee is still immobilized in a splint or cast, you can do straight leg raises. Lift the leg to 60 degrees, hold for 3 sec, and slowly lower the leg. Repeat 10-20 times 2-3 times daily. Perform this exercise against resistance later as your knee gets better.  Quad and Hamstring Sets - Tighten up the muscle on the front of the thigh (Quad) and hold for 5-10 sec. Repeat this 10-20 times hourly. Hamstring sets are done by pushing the foot backward against an object and holding for 5-10 sec. Repeat as with quad sets.  Leg Slides: Lying on your back, slowly slide your foot toward your buttocks, bending your knee up off the floor (only go as far as is comfortable). Then slowly slide your foot back down until your leg is flat on the floor again. Angel Wings: Lying on your back spread your legs to the side as far apart as you can without causing discomfort.  A rehabilitation program following serious knee injuries can speed recovery and prevent re-injury in the future due to weakened muscles. Contact your doctor or a physical therapist for more information on knee rehabilitation.   POST-OPERATIVE OPIOID TAPER INSTRUCTIONS: It is important to wean off of your opioid medication as soon as possible. If you do not need pain medication after your surgery it is ok to stop day one. Opioids include: Codeine, Hydrocodone(Norco, Vicodin), Oxycodone(Percocet, oxycontin) and hydromorphone amongst others.  Long term and even short term use of opiods can cause: Increased pain  response Dependence Constipation Depression Respiratory depression And more.  Withdrawal symptoms can include Flu like symptoms Nausea, vomiting And more Techniques to manage these symptoms Hydrate well Eat regular healthy meals Stay active Use relaxation techniques(deep breathing, meditating, yoga) Do Not substitute Alcohol to help with tapering If you have been on opioids for less than two weeks and do not have pain than it is ok to stop all together.  Plan to wean off of opioids This  plan should start within one week post op of your joint replacement. Maintain the same interval or time between taking each dose and first decrease the dose.  Cut the total daily intake of opioids by one tablet each day Next start to increase the time between doses. The last dose that should be eliminated is the evening dose.   IF YOU ARE TRANSFERRED TO A SKILLED REHAB FACILITY If the patient is transferred to a skilled rehab facility following release from the hospital, a list of the current medications will be sent to the facility for the patient to continue.  When discharged from the skilled rehab facility, please have the facility set up the patient's [place] prior to being released. Also, the skilled facility will be responsible for providing the patient with their medications at time of release from the facility to include their pain medication, the muscle relaxants, and their blood thinner medication. If the patient is still at the rehab facility at time of the two week follow up appointment, the skilled rehab facility will also need to assist the patient in arranging follow up appointment in our office and any transportation needs.  MAKE SURE YOU:  Understand these instructions.  Get help right away if you are not doing well or get worse.   DENTAL ANTIBIOTICS:  In most cases prophylactic antibiotics for Dental procdeures after total joint surgery are not  necessary.  Exceptions are as follows:  1. History of prior total joint infection  2. Severely immunocompromised (Organ Transplant, cancer chemotherapy, Rheumatoid biologic meds such as [place])  3. Poorly controlled diabetes (A1C &gt; 8.0, blood glucose over 200)  If you have one of these conditions, contact your surgeon for an antibiotic prescription, prior to your dental procedure.    Pick up stool softner and laxative for home use following surgery while on pain medications. Do not submerge incision under water. Please use good hand washing techniques while changing dressing each day. May shower starting three days after surgery. Please use a clean towel to pat the incision dry following showers. Continue to use ice for pain and swelling after surgery. Do not use any lotions or creams on the incision until instructed by your surgeon.

## 2020-12-20 NOTE — Transfer of Care (Signed)
Immediate Anesthesia Transfer of Care Note  Patient: Amy Newman  Procedure(s) Performed: TOTAL KNEE ARTHROPLASTY (Right: Knee)  Patient Location: PACU  Anesthesia Type:General  Level of Consciousness: drowsy  Airway & Oxygen Therapy: Patient Spontanous Breathing and Patient connected to face mask oxygen  Post-op Assessment: Report given to RN and Post -op Vital signs reviewed and stable  Post vital signs: Reviewed and stable  Last Vitals:  Vitals Value Taken Time  BP 120/59 12/20/20 1231  Temp    Pulse 61 12/20/20 1234  Resp 13 12/20/20 1234  SpO2 97 % 12/20/20 1234  Vitals shown include unvalidated device data.  Last Pain:  Vitals:   12/20/20 1017  TempSrc:   PainSc: 0-No pain         Complications: No notable events documented.

## 2020-12-20 NOTE — Plan of Care (Signed)

## 2020-12-20 NOTE — Anesthesia Procedure Notes (Signed)
Anesthesia Regional Block: Adductor canal block   Pre-Anesthetic Checklist: , timeout performed,  Correct Patient, Correct Site, Correct Laterality,  Correct Procedure, Correct Position, site marked,  Risks and benefits discussed,  Surgical consent,  Pre-op evaluation,  At surgeon's request and post-op pain management  Laterality: Right  Prep: chloraprep       Needles:  Injection technique: Single-shot  Needle Type: Echogenic Stimulator Needle     Needle Length: 10cm  Needle Gauge: 20     Additional Needles:   Procedures:,,,, ultrasound used (permanent image in chart),,    Narrative:  Start time: 12/20/2020 10:02 AM End time: 12/20/2020 10:06 AM Injection made incrementally with aspirations every 5 mL.  Performed by: Personally  Anesthesiologist: Lidia Collum, MD  Additional Notes: Standard monitors applied. Skin prepped. Good needle visualization with ultrasound. Injection made in 5cc increments with no resistance to injection. Patient tolerated the procedure well.

## 2020-12-20 NOTE — Evaluation (Signed)
Physical Therapy Evaluation Patient Details Name: Amy Newman MRN: [id number] DOB: 1950-09-07 Today's Date: 12/20/2020  History of Present Illness  Patient is 70 y.o. female s/p Rt TKA on12/19/22 with PMH significant for cervical cancer, OA, HTN, glaucoma, Rt RCR, Lt TKA on 08/23/20.   Clinical Impression  Amy Newman is a 70 y.o. female POD 0 s/p Rt TKA. Patient reports independence with mobility at baseline. Patient is now limited by functional impairments (see PT problem list below). Patient was limited by spinal block and LE weakness and unable to progress bed>chair. Patient instructed in exercise to facilitate circulation to manage edema and reduce risk of DVT. Patient will benefit from continued skilled PT interventions to address impairments and progress towards PLOF. Acute PT will follow to progress mobility and stair training in preparation for safe discharge home.        Recommendations for follow up therapy are one component of a multi-disciplinary discharge planning process, led by the attending physician.  Recommendations may be updated based on patient status, additional functional criteria and insurance authorization.  Follow Up Recommendations Follow physician's recommendations for discharge plan and follow up therapies    Assistance Recommended at Discharge Frequent or constant Supervision/Assistance  Functional Status Assessment Patient has had a recent decline in their functional status and demonstrates the ability to make significant improvements in function in a reasonable and predictable amount of time.  Equipment Recommendations  None recommended by PT    Recommendations for Other Services       Precautions / Restrictions Precautions Precautions: Fall Restrictions Weight Bearing Restrictions: No RLE Weight Bearing: Weight bearing as tolerated      Mobility  Bed Mobility Overal bed mobility: Needs Assistance Bed Mobility: Supine to Sit      Supine to sit: Min assist;HOB elevated     General bed mobility comments: cues to use bed rail, pt using bil UE's to assist LE's off EOB. min assist with bed pad to scoot.    Transfers Overall transfer level: Needs assistance Equipment used: Rolling walker (2 wheels) Transfers: Sit to/from Stand Sit to Stand: Mod assist;+2 physical assistance;+2 safety/equipment;From elevated surface           General transfer comment: Mod +2 for safety due to weakness secondary to spinal block. pt unable to progress to chair.    Ambulation/Gait                  Stairs            Wheelchair Mobility    Modified Rankin (Stroke Patients Only)       Balance Overall balance assessment: Needs assistance Sitting-balance support: Feet supported Sitting balance-Leahy Scale: Fair     Standing balance support: Reliant on assistive device for balance;Bilateral upper extremity supported;During functional activity Standing balance-Leahy Scale: Poor                               Pertinent Vitals/Pain Pain Assessment: No/denies pain    Home Living Family/patient expects to be discharged to:: Private residence Living Arrangements: Alone Available Help at Discharge: Family Type of Home: House Home Access: Stairs to enter Entrance Stairs-Rails: Right;Left;Can reach both Entrance Stairs-Number of Steps: 6   Home Layout: One level Home Equipment: Counsellor (2 wheels);Cane - single point      Prior Function Prior Level of Function : Independent/Modified Independent  Hand Dominance   Dominant Hand: Right    Extremity/Trunk Assessment   Upper Extremity Assessment Upper Extremity Assessment: Overall WFL for tasks assessed    Lower Extremity Assessment Lower Extremity Assessment: Generalized weakness;RLE deficits/detail (strength testing fluctating) RLE Deficits / Details: pt assisting intermittently with SLR but then  LE became "dead weight" when therapist stopped holding leg up. pt with stronger plantar flexor strength than dorsiflexion. pt able to clench glutes but unsteady in standing and weak with hip extensors. RLE Sensation:  (pt reprots parasthesia in feet)    Cervical / Trunk Assessment Cervical / Trunk Assessment: Normal  Communication   Communication: No difficulties  Cognition Arousal/Alertness: Awake/alert Behavior During Therapy: WFL for tasks assessed/performed Overall Cognitive Status: Within Functional Limits for tasks assessed                                          General Comments      Exercises Total Joint Exercises Ankle Circles/Pumps: AROM;Both;20 reps;Supine   Assessment/Plan    PT Assessment Patient needs continued PT services  PT Problem List Decreased strength;Decreased range of motion;Decreased balance;Decreased mobility;Decreased activity tolerance;Decreased knowledge of use of DME;Decreased knowledge of precautions;Decreased safety awareness       PT Treatment Interventions DME instruction;Gait training;Stair training;Therapeutic activities;Functional mobility training;Therapeutic exercise;Balance training;Patient/family education    PT Goals (Current goals can be found in the Care Plan section)  Acute Rehab PT Goals Patient Stated Goal: get home PT Goal Formulation: With patient Time For Goal Achievement: 12/27/20 Potential to Achieve Goals: Good    Frequency 7X/week   Barriers to discharge        Co-evaluation               AM-PAC PT "6 Clicks" Mobility  Outcome Measure Help needed turning from your back to your side while in a flat bed without using bedrails?: A Little Help needed moving from lying on your back to sitting on the side of a flat bed without using bedrails?: A Little Help needed moving to and from a bed to a chair (including a wheelchair)?: A Lot Help needed standing up from a chair using your arms (e.g.,  wheelchair or bedside chair)?: A Lot Help needed to walk in hospital room?: Total Help needed climbing 3-5 steps with a railing? : Total 6 Click Score: 12    End of Session Equipment Utilized During Treatment: Gait belt Activity Tolerance: Patient tolerated treatment well Patient left: in chair;with call bell/phone within reach;with chair alarm set;with family/visitor present Nurse Communication: Mobility status PT Visit Diagnosis: Muscle weakness (generalized) (M62.81);Difficulty in walking, not elsewhere classified (R26.2);Other abnormalities of gait and mobility (R26.89)    Time: 9379-0240 PT Time Calculation (min) (ACUTE ONLY): 16 min   Charges:   PT Evaluation $PT Eval Low Complexity: 1 Low          Verner Mould, DPT Acute Rehabilitation Services Office [phone] Pager [phone]   Jacques Navy 12/20/2020, 7:13 PM

## 2020-12-20 NOTE — Interval H&P Note (Signed)
History and Physical Interval Note:  12/20/2020 8:20 AM  Amy Newman  has presented today for surgery, with the diagnosis of right knee osteoarthritis.  The various methods of treatment have been discussed with the patient and family. After consideration of risks, benefits and other options for treatment, the patient has consented to  Procedure(s): TOTAL KNEE ARTHROPLASTY (Right) as a surgical intervention.  The patient's history has been reviewed, patient examined, no change in status, stable for surgery.  I have reviewed the patient's chart and labs.  Questions were answered to the patient's satisfaction.     Pilar Plate Otha Monical

## 2020-12-20 NOTE — Anesthesia Procedure Notes (Signed)
Spinal  Patient location during procedure: OR Start time: 12/20/2020 10:48 AM End time: 12/20/2020 10:51 AM Reason for block: surgical anesthesia Staffing Performed: resident/CRNA  Resident/CRNA: Milford Cage, CRNA Preanesthetic Checklist Completed: patient identified, IV checked, site marked, risks and benefits discussed, surgical consent, monitors and equipment checked, pre-op evaluation and timeout performed Spinal Block Patient position: sitting Prep: ChloraPrep Patient monitoring: heart rate, cardiac monitor, continuous pulse ox and blood pressure Approach: midline Location: L3-4 Injection technique: single-shot Needle Needle type: Pencan  Needle gauge: 24 G Needle length: 10 cm Assessment Sensory level: T6 Events: failed spinal and CSF return Additional Notes Functioning IV was confirmed and monitors were applied. Sterile prep and drape, including hand hygiene and sterile gloves were used. The patient was positioned and the spine was prepped. The skin was anesthetized with lidocaine.  Free flow of clear CSF was obtained prior to injecting local anesthetic into the CSF.  The spinal needle aspirated freely following injection.  The needle was carefully withdrawn.  The patient tolerated the procedure well. Upon incision, patient moving leg.

## 2020-12-20 NOTE — Care Plan (Signed)
Ortho Bundle Case Management Note  Patient Details  Name: Amy Newman MRN: [id number] Date of Birth: 1950/07/29  R TKA on 12-20-20 DCP:  Home with son DME:  No needs PT:  [facility] in [place] on 12-23-20                    DME Arranged:  N/A DME Agency:  NA  HH Arranged:  NA [place] Agency:  NA  Additional Comments: Please contact me with any questions of if this plan should need to change.  Marianne Sofia, RN,CCM [facility]  [phone] 12/20/2020, 5:18 PM

## 2020-12-20 NOTE — Op Note (Signed)
OPERATIVE REPORT-TOTAL KNEE ARTHROPLASTY   Pre-operative diagnosis- Osteoarthritis  Right knee(s)  Post-operative diagnosis- Osteoarthritis Right knee(s)  Procedure-  Right  Total Knee Arthroplasty  Surgeon- Dione Plover. Makiya Jeune, MD  Assistant- Theresa Duty, PA-C   Anesthesia-   Adductor canal block and spinal  then General  EBL-100 mL   Drains None  Tourniquet time-  Total Tourniquet Time Documented: Thigh (Right) - 6 minutes Thigh (Right) - 39 minutes Total: Thigh (Right) - 45 minutes     Complications- None  Condition-PACU - hemodynamically stable.   Brief Clinical Note  Amy Newman is a 70 y.o. year old female with end stage OA of her right knee with progressively worsening pain and dysfunction. She has constant pain, with activity and at rest and significant functional deficits with difficulties even with ADLs. She has had extensive non-op management including analgesics, injections of cortisone and viscosupplements, and home exercise program, but remains in significant pain with significant dysfunction.Radiographs show bone on bone arthritis all 3 compartments. She presents now for right Total Knee Arthroplasty.     Procedure in detail---   The patient is brought into the operating room and positioned supine on the operating table. After successful administration of  Adductor canal block and spinal then general anesthetic,   a tourniquet is placed high on the  Right thigh(s) and the lower extremity is prepped and draped in the usual sterile fashion. Time out is performed by the operating team and then the  Right lower extremity is wrapped in Esmarch, knee flexed and the tourniquet inflated to 300 mmHg.       A midline incision is made with a ten blade through the subcutaneous tissue to the level of the extensor mechanism. A fresh blade is used to make a medial parapatellar arthrotomy. Soft tissue over the proximal medial tibia is subperiosteally elevated to the  joint line with a knife and into the semimembranosus bursa with a Cobb elevator. Soft tissue over the proximal lateral tibia is elevated with attention being paid to avoiding the patellar tendon on the tibial tubercle. The patella is everted, knee flexed 90 degrees and the ACL and PCL are removed. Findings are bone on bone all 3 compartments with massive global osteophytes        The drill is used to create a starting hole in the distal femur and the canal is thoroughly irrigated with sterile saline to remove the fatty contents. The 5 degree Right  valgus alignment guide is placed into the femoral canal and the distal femoral cutting block is pinned to remove 10 mm off the distal femur. Resection is made with an oscillating saw.      The tibia is subluxed forward and the menisci are removed. The extramedullary alignment guide is placed referencing proximally at the medial aspect of the tibial tubercle and distally along the second metatarsal axis and tibial crest. The block is pinned to remove 22mm off the more deficient medial  side. Resection is made with an oscillating saw. Size 5is the most appropriate size for the tibia and the proximal tibia is prepared with the modular drill and keel punch for that size.      The femoral sizing guide is placed and size 6 is most appropriate. Rotation is marked off the epicondylar axis and confirmed by creating a rectangular flexion gap at 90 degrees. The size 6 cutting block is pinned in this rotation and the anterior, posterior and chamfer cuts are made with the oscillating saw.  The intercondylar block is then placed and that cut is made.      Trial size 5 tibial component, trial size 6 posterior stabilized femur and a 12  mm posterior stabilized rotating platform insert trial is placed. Full extension is achieved with excellent varus/valgus and anterior/posterior balance throughout full range of motion. The patella is everted and thickness measured to be 22  mm. Free  hand resection is taken to 122 mm, a 38 template is placed, lug holes are drilled, trial patella is placed, and it tracks normally. Osteophytes are removed off the posterior femur with the trial in place. All trials are removed and the cut bone surfaces prepared with pulsatile lavage. Cement is mixed and once ready for implantation, the size 5 tibial implant, size  6 posterior stabilized femoral component, and the size 38 patella are cemented in place and the patella is held with the clamp. The trial insert is placed and the knee held in full extension. The Exparel (20 ml mixed with 60 ml saline) is injected into the extensor mechanism, posterior capsule, medial and lateral gutters and subcutaneous tissues.  All extruded cement is removed and once the cement is hard the permanent 12 mm posterior stabilized rotating platform insert is placed into the tibial tray.      The wound is copiously irrigated with saline solution and the extensor mechanism closed with # 0 Stratofix suture. The tourniquet is released for a total tourniquet time of 45  minutes. Flexion against gravity is 140 degrees and the patella tracks normally. Subcutaneous tissue is closed with 2.0 vicryl and subcuticular with running 4.0 Monocryl. The incision is cleaned and dried and steri-strips and a bulky sterile dressing are applied. The limb is placed into a knee immobilizer and the patient is awakened and transported to recovery in stable condition.      Please note that a surgical assistant was a medical necessity for this procedure in order to perform it in a safe and expeditious manner. Surgical assistant was necessary to retract the ligaments and vital neurovascular structures to prevent injury to them and also necessary for proper positioning of the limb to allow for anatomic placement of the prosthesis.   Dione Plover Dariela Stoker, MD    12/20/2020, 12:07 PM

## 2020-12-20 NOTE — Anesthesia Preprocedure Evaluation (Signed)
Anesthesia Evaluation  Patient identified by MRN, date of birth, ID band Patient awake    Reviewed: Allergy & Precautions, NPO status , Patient's Chart, lab work & pertinent test results, reviewed documented beta blocker date and time   History of Anesthesia Complications Negative for: history of anesthetic complications  Airway Mallampati: I  TM Distance: >3 FB Neck ROM: Full    Dental  (+) Teeth Intact   Pulmonary neg pulmonary ROS,    Pulmonary exam normal        Cardiovascular hypertension, Pt. on medications and Pt. on home beta blockers Normal cardiovascular exam     Neuro/Psych Lumbar stenosis    GI/Hepatic negative GI ROS, Neg liver ROS,   Endo/Other  negative endocrine ROS  Renal/GU negative Renal ROS  negative genitourinary   Musculoskeletal  (+) Arthritis , Osteoarthritis,    Abdominal   Peds  Hematology negative hematology ROS (+)   Anesthesia Other Findings  Plts 228, INR 1.0  Reproductive/Obstetrics                            Anesthesia Physical Anesthesia Plan  ASA: 2  Anesthesia Plan: Spinal   Post-op Pain Management: Ofirmev IV (intra-op) and Regional block   Induction:   PONV Risk Score and Plan: 2 and Propofol infusion, Treatment may vary due to age or medical condition, Ondansetron and TIVA  Airway Management Planned: Nasal Cannula and Simple Face Mask  Additional Equipment: None  Intra-op Plan:   Post-operative Plan:   Informed Consent: I have reviewed the patients History and Physical, chart, labs and discussed the procedure including the risks, benefits and alternatives for the proposed anesthesia with the patient or authorized representative who has indicated his/her understanding and acceptance.       Plan Discussed with:   Anesthesia Plan Comments:         Anesthesia Quick Evaluation

## 2020-12-20 NOTE — Anesthesia Postprocedure Evaluation (Signed)
Anesthesia Post Note  Patient: Amy Newman  Procedure(s) Performed: TOTAL KNEE ARTHROPLASTY (Right: Knee)     Patient location during evaluation: PACU Anesthesia Type: General Level of consciousness: awake and alert Pain management: pain level controlled Vital Signs Assessment: post-procedure vital signs reviewed and stable Respiratory status: spontaneous breathing, nonlabored ventilation and respiratory function stable Cardiovascular status: blood pressure returned to baseline and stable Postop Assessment: no apparent nausea or vomiting Anesthetic complications: no   No notable events documented.  Last Vitals:  Vitals:   12/20/20 1330 12/20/20 1357  BP: 137/70 136/70  Pulse: (!) 53 (!) 53  Resp: 16 16  Temp: 36.4 C 36.4 C  SpO2: 100% 97%    Last Pain:  Vitals:   12/20/20 1357  TempSrc: Oral  PainSc: 0-No pain                 Lidia Collum

## 2020-12-20 NOTE — Anesthesia Procedure Notes (Signed)
Procedure Name: LMA Insertion Date/Time: 12/20/2020 11:20 AM Performed by: Milford Cage, CRNA Pre-anesthesia Checklist: Patient identified, Emergency Drugs available, Suction available and Patient being monitored Patient Re-evaluated:Patient Re-evaluated prior to induction Oxygen Delivery Method: Circle system utilized Preoxygenation: Pre-oxygenation with 100% oxygen Induction Type: IV induction Ventilation: Mask ventilation without difficulty LMA: LMA inserted LMA Size: 4.0 Number of attempts: 1 Tube secured with: Tape Dental Injury: Teeth and Oropharynx as per pre-operative assessment

## 2020-12-20 NOTE — Progress Notes (Signed)
Orthopedic Tech Progress Note Patient Details:  Amy Newman 12-19-1950 [id number]  CPM Right Knee CPM Right Knee: On Right Knee Flexion (Degrees): 10 Right Knee Extension (Degrees): 40  Post Interventions Patient Tolerated: Well  [name] [name] 12/20/2020, 1:28 PM

## 2020-12-21 ENCOUNTER — Encounter (HOSPITAL_COMMUNITY): Payer: Self-pay | Admitting: Orthopedic Surgery

## 2020-12-21 DIAGNOSIS — Z96651 Presence of right artificial knee joint: Secondary | ICD-10-CM | POA: Diagnosis not present

## 2020-12-21 DIAGNOSIS — Z79899 Other long term (current) drug therapy: Secondary | ICD-10-CM | POA: Diagnosis not present

## 2020-12-21 DIAGNOSIS — M1711 Unilateral primary osteoarthritis, right knee: Secondary | ICD-10-CM | POA: Diagnosis not present

## 2020-12-21 DIAGNOSIS — Z7982 Long term (current) use of aspirin: Secondary | ICD-10-CM | POA: Diagnosis not present

## 2020-12-21 DIAGNOSIS — Z8541 Personal history of malignant neoplasm of cervix uteri: Secondary | ICD-10-CM | POA: Diagnosis not present

## 2020-12-21 DIAGNOSIS — I10 Essential (primary) hypertension: Secondary | ICD-10-CM | POA: Diagnosis not present

## 2020-12-21 LAB — CBC
HCT: 33.3 % — ABNORMAL LOW (ref 36.0–46.0)
Hemoglobin: 10.8 g/dL — ABNORMAL LOW (ref 12.0–15.0)
MCH: 29.8 pg (ref 26.0–34.0)
MCHC: 32.4 g/dL (ref 30.0–36.0)
MCV: 91.7 fL (ref 80.0–100.0)
Platelets: 230 10*3/uL (ref 150–400)
RBC: 3.63 MIL/uL — ABNORMAL LOW (ref 3.87–5.11)
RDW: 13.7 % (ref 11.5–15.5)
WBC: 9.5 10*3/uL (ref 4.0–10.5)
nRBC: 0 % (ref 0.0–0.2)

## 2020-12-21 LAB — BASIC METABOLIC PANEL
Anion gap: 8 (ref 5–15)
BUN: 20 mg/dL (ref 8–23)
CO2: 24 mmol/L (ref 22–32)
Calcium: 8.6 mg/dL — ABNORMAL LOW (ref 8.9–10.3)
Chloride: 108 mmol/L (ref 98–111)
Creatinine, Ser: 1.16 mg/dL — ABNORMAL HIGH (ref 0.44–1.00)
GFR, Estimated: 51 mL/min — ABNORMAL LOW (ref >60)
Glucose, Bld: 138 mg/dL — ABNORMAL HIGH (ref 70–99)
Potassium: 3.7 mmol/L (ref 3.5–5.1)
Sodium: 140 mmol/L (ref 135–145)

## 2020-12-21 MED ORDER — ASPIRIN 325 MG PO TBEC: 325.0000 mg | DELAYED_RELEASE_TABLET | Freq: Two times a day (BID) | ORAL | 0 refills | Status: AC

## 2020-12-21 MED ORDER — GABAPENTIN 300 MG PO CAPS: ORAL_CAPSULE | ORAL | 0 refills | Status: DC

## 2020-12-21 MED ORDER — METHOCARBAMOL 500 MG PO TABS: 500.0000 mg | ORAL_TABLET | Freq: Four times a day (QID) | ORAL | 0 refills | Status: DC | PRN

## 2020-12-21 MED ORDER — OXYCODONE HCL 5 MG PO TABS: 5.0000 mg | ORAL_TABLET | Freq: Four times a day (QID) | ORAL | 0 refills | Status: DC | PRN

## 2020-12-21 NOTE — Progress Notes (Signed)
° °  Subjective: 1 Day Post-Op Procedure(s) (LRB): TOTAL KNEE ARTHROPLASTY (Right) Patient reports pain as mild.   Patient seen in rounds by Dr. Wynelle Link. Patient is well, and has had no acute complaints or problems. States she is ready to go home. Denies chest pain or SOB. No issues overnight, foley catheter removed this AM. We will continue therapy today.   Objective: Vital signs in last 24 hours: Temp:  [96 F (35.6 C)-98.6 F (37 C)] 97.5 F (36.4 C) (12/20 0545) Pulse Rate:  [53-80] 74 (12/20 0545) Resp:  [10-26] 18 (12/20 0545) BP: (116-179)/(54-83) 149/79 (12/20 0545) SpO2:  [95 %-100 %] 100 % (12/20 0545) Weight:  [96.6 kg] 96.6 kg (12/19 0846)  Intake/Output from previous day:  Intake/Output Summary (Last 24 hours) at 12/21/2020 0722 Last data filed at 12/21/2020 0615 Gross per 24 hour  Intake 2557.04 ml  Output 1125 ml  Net 1432.04 ml     Intake/Output this shift: No intake/output data recorded.  Labs: Recent Labs    12/21/20 0329  HGB 10.8*   Recent Labs    12/21/20 0329  WBC 9.5  RBC 3.63*  HCT 33.3*  PLT 230   Recent Labs    12/21/20 0329  NA 140  K 3.7  CL 108  CO2 24  BUN 20  CREATININE 1.16*  GLUCOSE 138*  CALCIUM 8.6*   No results for input(s): LABPT, INR in the last 72 hours.  Exam: General - Patient is Alert and Oriented Extremity - Neurologically intact Neurovascular intact Sensation intact distally Dorsiflexion/Plantar flexion intact Dressing - dressing C/D/I Motor Function - intact, moving foot and toes well on exam.   Past Medical History:  Diagnosis Date   Arthritis    Cancer ([place])    cervical cancer   Glaucoma    H/O: cesarean section    Hypertension    Lumbar spinal stenosis 09/29/2020   Obese    PONV (postoperative nausea and vomiting)     Assessment/Plan: 1 Day Post-Op Procedure(s) (LRB): TOTAL KNEE ARTHROPLASTY (Right) Principal Problem:   Primary osteoarthritis of right knee  Estimated body mass index  is 32.39 kg/m as calculated from the following:   Height as of this encounter: 5\' 8"  (1.727 m).   Weight as of this encounter: 96.6 kg. Advance diet Up with therapy D/C IV fluids   Patient's anticipated LOS is less than 2 midnights, meeting these requirements: - Younger than 48 - Lives within 1 hour of care - Has a competent adult at home to recover with post-op recover - NO history of  - Chronic pain requiring opiods  - Diabetes  - Coronary Artery Disease  - Heart failure  - Heart attack  - Stroke  - DVT/VTE  - Cardiac arrhythmia  - Respiratory Failure/COPD  - Renal failure  - Anemia  - Advanced Liver disease  DVT Prophylaxis - Aspirin Weight bearing as tolerated. Continue therapy.  Plan is to go Home after hospital stay. Plan for discharge later today if meeting goals with therapy. Scheduled [facility] at [facility] Follow-up in the office in 2 weeks  The [place] was reviewed today prior to any opioid medications being prescribed to this patient.  Theresa Duty, PA-C Orthopedic Surgery [phone] 12/21/2020, 7:22 AM

## 2020-12-21 NOTE — Plan of Care (Signed)
°  Problem: Education: Goal: Knowledge of the prescribed therapeutic regimen will improve Outcome: Adequate for Discharge Goal: Individualized Educational Video(s) Outcome: Adequate for Discharge   Problem: Activity: Goal: Ability to avoid complications of mobility impairment will improve Outcome: Adequate for Discharge Goal: Range of joint motion will improve Outcome: Adequate for Discharge   Problem: Clinical Measurements: Goal: Postoperative complications will be avoided or minimized Outcome: Adequate for Discharge   Problem: Pain Management: Goal: Pain level will decrease with appropriate interventions Outcome: Adequate for Discharge   Problem: Skin Integrity: Goal: Will show signs of wound healing Outcome: Adequate for Discharge   Problem: Education: Goal: Knowledge of General Education information will improve Description: Including pain rating scale, medication(s)/side effects and non-pharmacologic comfort measures Outcome: Adequate for Discharge   Problem: Health Behavior/Discharge Planning: Goal: Ability to manage health-related needs will improve Outcome: Adequate for Discharge   Problem: Clinical Measurements: Goal: Ability to maintain clinical measurements within normal limits will improve Outcome: Adequate for Discharge Goal: Will remain free from infection Outcome: Adequate for Discharge Goal: Diagnostic test results will improve Outcome: Adequate for Discharge Goal: Respiratory complications will improve Outcome: Adequate for Discharge Goal: Cardiovascular complication will be avoided Outcome: Adequate for Discharge   Problem: Activity: Goal: Risk for activity intolerance will decrease Outcome: Adequate for Discharge   Problem: Nutrition: Goal: Adequate nutrition will be maintained Outcome: Adequate for Discharge   Problem: Coping: Goal: Level of anxiety will decrease Outcome: Adequate for Discharge   Problem: Elimination: Goal: Will not  experience complications related to bowel motility Outcome: Adequate for Discharge Goal: Will not experience complications related to urinary retention Outcome: Adequate for Discharge   Problem: Pain Managment: Goal: General experience of comfort will improve Outcome: Adequate for Discharge   Problem: Safety: Goal: Ability to remain free from injury will improve Outcome: Adequate for Discharge   Problem: Skin Integrity: Goal: Risk for impaired skin integrity will decrease Outcome: Adequate for Discharge   Problem: Acute Rehab PT Goals(only PT should resolve) Goal: Pt Will Go Supine/Side To Sit Outcome: Adequate for Discharge Goal: Patient Will Transfer Sit To/From Stand Outcome: Adequate for Discharge Goal: Pt Will Ambulate Outcome: Adequate for Discharge Goal: Pt Will Go Up/Down Stairs Outcome: Adequate for Discharge

## 2020-12-21 NOTE — TOC Transition Note (Signed)
Transition of Care [facility]) - CM/SW Discharge Note   Patient Details  Name: Amy Newman MRN: [id number] Date of Birth: 06/22/1950  [facility]) CM/SW Contact:  Lennart Pall, LCSW Phone Number: 12/21/2020, 9:53 AM   Clinical Narrative:    Met with pt and confirming she has all needed DME at home. Plan for OPPT at [facility].  No TOC needs.   Final next level of care: OP Rehab Barriers to Discharge: No Barriers Identified   Patient Goals and CMS Choice Patient states their goals for this hospitalization and ongoing recovery are:: return home      Discharge Placement                       Discharge Plan and Services                DME Arranged: N/A DME Agency: NA       HH Arranged: NA HH Agency: NA        Social Determinants of Health (SDOH) Interventions     Readmission Risk Interventions No flowsheet data found.

## 2020-12-21 NOTE — Progress Notes (Signed)
Physical Therapy Treatment Patient Details Name: Amy Newman MRN: [id number] DOB: [birth year] Today's Date: 12/21/2020   History of Present Illness Patient is 70 y.o. female s/p Rt TKA on12/19/22 with PMH significant for cervical cancer, OA, HTN, glaucoma, Rt RCR, Lt TKA on 08/23/20.    PT Comments    2nd session to practice stair negotiation. Pt stated she remembers her exercises from before. Encouraged her to walk often at home and to perform exercises as tolerated. All education completed.     Recommendations for follow up therapy are one component of a multi-disciplinary discharge planning process, led by the attending physician.  Recommendations may be updated based on patient status, additional functional criteria and insurance authorization.  Follow Up Recommendations  Follow physician's recommendations for discharge plan and follow up therapies     Assistance Recommended at Discharge Intermittent Supervision/Assistance  Equipment Recommendations  None recommended by PT    Recommendations for Other Services       Precautions / Restrictions Precautions Precautions: Fall;Knee Restrictions Weight Bearing Restrictions: No RLE Weight Bearing: Weight bearing as tolerated     Mobility  Bed Mobility               General bed mobility comments: oob in recliner    Transfers Overall transfer level: Needs assistance Equipment used: Rolling walker (2 wheels) Transfers: Sit to/from Stand Sit to Stand: Min assist           General transfer comment: Assist to steady. Cues for safety.    Ambulation/Gait Ambulation/Gait assistance: Min guard Gait Distance (Feet): 75 Feet Assistive device: Rolling walker (2 wheels) Gait Pattern/deviations: Step-to pattern;Antalgic;Decreased stance time - right       General Gait Details: Cues for safety, technique, sequence, pacing. Min guard for safety.   Stairs Stairs: Yes Stairs assistance: Min assist Stair  Management: Step to pattern;Forwards;Two rails Number of Stairs: 2 General stair comments: up and over portable stairs x 1. cues for safety, technique, sequence. assist to steady   Wheelchair Mobility    Modified Rankin (Stroke Patients Only)       Balance Overall balance assessment: Needs assistance         Standing balance support: Bilateral upper extremity supported;Reliant on assistive device for balance Standing balance-Leahy Scale: Fair                              Cognition Arousal/Alertness: Awake/alert Behavior During Therapy: WFL for tasks assessed/performed Overall Cognitive Status: Within Functional Limits for tasks assessed                                          Exercises      General Comments        Pertinent Vitals/Pain Pain Assessment: Faces Faces Pain Scale: Hurts even more Pain Location: R knee with activity Pain Descriptors / Indicators: Discomfort;Sore;Aching;Sharp Pain Intervention(s): Limited activity within patient's tolerance;Monitored during session;Repositioned    Home Living                          Prior Function            PT Goals (current goals can now be found in the care plan section) Progress towards PT goals: Progressing toward goals    Frequency    7X/week  PT Plan Current plan remains appropriate    Co-evaluation              AM-PAC PT "6 Clicks" Mobility   Outcome Measure  Help needed turning from your back to your side while in a flat bed without using bedrails?: A Little Help needed moving from lying on your back to sitting on the side of a flat bed without using bedrails?: A Little Help needed moving to and from a bed to a chair (including a wheelchair)?: A Little Help needed standing up from a chair using your arms (e.g., wheelchair or bedside chair)?: A Little Help needed to walk in hospital room?: A Little Help needed climbing 3-5 steps with a  railing? : A Little 6 Click Score: 18    End of Session Equipment Utilized During Treatment: Gait belt Activity Tolerance: Patient tolerated treatment well Patient left: in chair;with call bell/phone within reach   PT Visit Diagnosis: Other abnormalities of gait and mobility (R26.89);Pain Pain - Right/Left: Right Pain - part of body: Knee     Time: 1400-1419 PT Time Calculation (min) (ACUTE ONLY): 19 min  Charges:  $Gait Training: 8-22 mins                        Doreatha Massed, PT Acute Rehabilitation  Office: [phone] Pager: [phone]

## 2020-12-21 NOTE — Progress Notes (Signed)
Physical Therapy Treatment Patient Details Name: Amy Newman MRN: [id number] DOB: [birth year] Today's Date: 12/21/2020   History of Present Illness Patient is 70 y.o. female s/p Rt TKA on12/19/22 with PMH significant for cervical cancer, OA, HTN, glaucoma, Rt RCR, Lt TKA on 08/23/20.    PT Comments    Progressing with mobility. Will plan to have a 2nd session to practice stair negotiation prior to potential d/c home later today.    Recommendations for follow up therapy are one component of a multi-disciplinary discharge planning process, led by the attending physician.  Recommendations may be updated based on patient status, additional functional criteria and insurance authorization.  Follow Up Recommendations  Follow physician's recommendations for discharge plan and follow up therapies     Assistance Recommended at Discharge Intermittent Supervision/Assistance  Equipment Recommendations       Recommendations for Other Services       Precautions / Restrictions Precautions Precautions: Fall;Knee Restrictions Weight Bearing Restrictions: No RLE Weight Bearing: Weight bearing as tolerated     Mobility  Bed Mobility Overal bed mobility: Needs Assistance Bed Mobility: Supine to Sit     Supine to sit: Supervision     General bed mobility comments: Supv for safety.    Transfers Overall transfer level: Needs assistance Equipment used: Rolling walker (2 wheels) Transfers: Sit to/from Stand Sit to Stand: Min assist;From elevated surface           General transfer comment: Assist to rise, steady, control descent. Cues for safety, technique, hand/Le placement    Ambulation/Gait Ambulation/Gait assistance: Min guard Gait Distance (Feet): 90 Feet Assistive device: Rolling walker (2 wheels) Gait Pattern/deviations: Step-to pattern;Antalgic;Decreased stance time - right       General Gait Details: Cues for safety, technique, sequence, pacing. Min guard for  safety.   Stairs             Wheelchair Mobility    Modified Rankin (Stroke Patients Only)       Balance Overall balance assessment: Needs assistance         Standing balance support: Bilateral upper extremity supported;Reliant on assistive device for balance Standing balance-Leahy Scale: Fair                              Cognition Arousal/Alertness: Awake/alert Behavior During Therapy: WFL for tasks assessed/performed Overall Cognitive Status: Within Functional Limits for tasks assessed                                          Exercises Total Joint Exercises Ankle Circles/Pumps: AROM;Both;10 reps Quad Sets: AROM;Both;10 reps Heel Slides: AAROM;Right;10 reps Hip ABduction/ADduction: AROM;Right;10 reps Straight Leg Raises: AROM;Right;10 reps Goniometric ROM: ~10-65 degrees    General Comments        Pertinent Vitals/Pain Pain Assessment: Faces Faces Pain Scale: Hurts even more Pain Location: R knee with activity Pain Descriptors / Indicators: Discomfort;Sore;Aching;Sharp Pain Intervention(s): Limited activity within patient's tolerance;Monitored during session;Repositioned;Ice applied    Home Living                          Prior Function            PT Goals (current goals can now be found in the care plan section) Progress towards PT goals: Progressing toward goals    Frequency  7X/week      PT Plan Current plan remains appropriate    Co-evaluation              AM-PAC PT "6 Clicks" Mobility   Outcome Measure  Help needed turning from your back to your side while in a flat bed without using bedrails?: A Little Help needed moving from lying on your back to sitting on the side of a flat bed without using bedrails?: A Little Help needed moving to and from a bed to a chair (including a wheelchair)?: A Little Help needed standing up from a chair using your arms (e.g., wheelchair or bedside  chair)?: A Little Help needed to walk in hospital room?: A Little Help needed climbing 3-5 steps with a railing? : A Lot 6 Click Score: 17    End of Session Equipment Utilized During Treatment: Gait belt Activity Tolerance: Patient tolerated treatment well Patient left: in chair;with call bell/phone within reach   PT Visit Diagnosis: Other abnormalities of gait and mobility (R26.89);Pain Pain - Right/Left: Right Pain - part of body: Knee     Time: 3291-9166 PT Time Calculation (min) (ACUTE ONLY): 20 min  Charges:  $Gait Training: 8-22 mins                         Doreatha Massed, PT Acute Rehabilitation  Office: [phone] Pager: [phone]

## 2020-12-22 NOTE — Discharge Summary (Signed)
Patient ID: Amy Newman MRN: [id number] DOB/AGE: April 12, 1950 70 y.o.  Admit date: 12/20/2020 Discharge date: 12/22/2020  Admission Diagnoses:  Principal Problem:   Primary osteoarthritis of right knee   Discharge Diagnoses:  Same  Past Medical History:  Diagnosis Date   Arthritis    Cancer ([place])    cervical cancer   Glaucoma    H/O: cesarean section    Hypertension    Lumbar spinal stenosis 09/29/2020   Obese    PONV (postoperative nausea and vomiting)     Surgeries: Procedure(s): TOTAL KNEE ARTHROPLASTY on 12/20/2020   Consultants:   Discharged Condition: Improved  Hospital Course: TRISTINA SAHAGIAN is an 70 y.o. female who was admitted 12/20/2020 for operative treatment ofPrimary osteoarthritis of right knee. Patient has severe unremitting pain that affects sleep, daily activities, and work/hobbies. After pre-op clearance the patient was taken to the operating room on 12/20/2020 and underwent  Procedure(s): TOTAL KNEE ARTHROPLASTY.    Patient was given perioperative antibiotics:  Anti-infectives (From admission, onward)    Start     Dose/Rate Route Frequency Ordered Stop   12/20/20 1800  ceFAZolin (ANCEF) IVPB 2g/100 mL premix        2 g 200 mL/hr over 30 Minutes Intravenous Every 6 hours 12/20/20 1212 12/21/20 0011   12/20/20 0830  ceFAZolin (ANCEF) IVPB 2g/100 mL premix        2 g 200 mL/hr over 30 Minutes Intravenous  Once 12/20/20 0822 12/20/20 1058   12/20/20 0815  vancomycin (VANCOCIN) IVPB 1000 mg/200 mL premix  Status:  Discontinued        1,000 mg 200 mL/hr over 60 Minutes Intravenous On call to O.R. 12/20/20 0805 12/20/20 9024        Patient was given sequential compression devices, early ambulation, and chemoprophylaxis to prevent DVT.  Patient benefited maximally from hospital stay and there were no complications.    Recent vital signs: Patient Vitals for the past 24 hrs:  BP Pulse Resp SpO2  12/21/20 1428 (!) 154/66 74 18 98 %      Recent laboratory studies:  Recent Labs    12/21/20 0329  WBC 9.5  HGB 10.8*  HCT 33.3*  PLT 230  NA 140  K 3.7  CL 108  CO2 24  BUN 20  CREATININE 1.16*  GLUCOSE 138*  CALCIUM 8.6*     Discharge Medications:   Allergies as of 12/21/2020       Reactions   Hydrocodone Nausea Only   Tramadol Nausea Only   Penicillins Hives, Swelling, Rash   Tolerated Ancef 2 grams 08-23-20 Tolerated Cephalosporin Date: 12/21/20.        Medication List     STOP taking these medications    ibuprofen 800 MG tablet Commonly known as: ADVIL       TAKE these medications    aspirin 325 MG EC tablet Take 1 tablet (325 mg total) by mouth 2 (two) times daily for 20 days. Then resume one 81 mg aspirin once a day. What changed:  medication strength how much to take when to take this additional instructions   atenolol 50 MG tablet Commonly known as: TENORMIN Take 50 mg by mouth at bedtime.   atorvastatin 20 MG tablet Commonly known as: LIPITOR Take 20 mg by mouth at bedtime.   brimonidine 0.2 % ophthalmic solution Commonly known as: ALPHAGAN Place 1 drop into both eyes 2 (two) times daily.   cetirizine 10 MG tablet Commonly known as: ZYRTEC Take 10  mg by mouth daily as needed for allergies.   CITRACAL PO Take 1 tablet by mouth daily.   gabapentin 300 MG capsule Commonly known as: NEURONTIN Take a 300 mg capsule three times a day for two weeks following surgery.Then take a 300 mg capsule two times a day for two weeks. Then take a 300 mg capsule once a day for two weeks. Then discontinue.   latanoprost 0.005 % ophthalmic solution Commonly known as: XALATAN Place 1 drop into both eyes at bedtime.   meclizine 25 MG tablet Commonly known as: ANTIVERT Take 25 mg by mouth every 6 (six) hours as needed for dizziness.   methocarbamol 500 MG tablet Commonly known as: ROBAXIN Take 1 tablet (500 mg total) by mouth every 6 (six) hours as needed for muscle spasms.    multivitamin with minerals Tabs tablet Take 1 tablet by mouth in the morning.   omeprazole 20 MG capsule Commonly known as: PRILOSEC Take 20 mg by mouth daily before breakfast.   oxyCODONE 5 MG immediate release tablet Commonly known as: Oxy IR/ROXICODONE Take 1-2 tablets (5-10 mg total) by mouth every 6 (six) hours as needed for moderate pain or severe pain.               Discharge Care Instructions  (From admission, onward)           Start     Ordered   12/21/20 0000  Weight bearing as tolerated        12/21/20 0726   12/21/20 0000  Change dressing       Comments: You may remove the bulky bandage (ACE wrap and gauze) two days after surgery. You will have an adhesive waterproof bandage underneath. Leave this in place until your first follow-up appointment.   12/21/20 0726            Diagnostic Studies: No results found.  Disposition: Discharge disposition: 01-Home or Self Care       Discharge Instructions     Call MD / Call 911   Complete by: As directed    If you experience chest pain or shortness of breath, CALL 911 and be transported to the hospital emergency room.  If you develope a fever above 101 F, pus (white drainage) or increased drainage or redness at the wound, or calf pain, call your surgeon's office.   Change dressing   Complete by: As directed    You may remove the bulky bandage (ACE wrap and gauze) two days after surgery. You will have an adhesive waterproof bandage underneath. Leave this in place until your first follow-up appointment.   Constipation Prevention   Complete by: As directed    Drink plenty of fluids.  Prune juice may be helpful.  You may use a stool softener, such as Colace (over the counter) 100 mg twice a day.  Use MiraLax (over the counter) for constipation as needed.   Diet - low sodium heart healthy   Complete by: As directed    Do not put a pillow under the knee. Place it under the heel.   Complete by: As directed     Driving restrictions   Complete by: As directed    No driving for two weeks   Post-operative opioid taper instructions:   Complete by: As directed    POST-OPERATIVE OPIOID TAPER INSTRUCTIONS: It is important to wean off of your opioid medication as soon as possible. If you do not need pain medication after your surgery it is  ok to stop day one. Opioids include: Codeine, Hydrocodone(Norco, Vicodin), Oxycodone(Percocet, oxycontin) and hydromorphone amongst others.  Long term and even short term use of opiods can cause: Increased pain response Dependence Constipation Depression Respiratory depression And more.  Withdrawal symptoms can include Flu like symptoms Nausea, vomiting And more Techniques to manage these symptoms Hydrate well Eat regular healthy meals Stay active Use relaxation techniques(deep breathing, meditating, yoga) Do Not substitute Alcohol to help with tapering If you have been on opioids for less than two weeks and do not have pain than it is ok to stop all together.  Plan to wean off of opioids This plan should start within one week post op of your joint replacement. Maintain the same interval or time between taking each dose and first decrease the dose.  Cut the total daily intake of opioids by one tablet each day Next start to increase the time between doses. The last dose that should be eliminated is the evening dose.      TED hose   Complete by: As directed    Use stockings (TED hose) for three weeks on both leg(s).  You may remove them at night for sleeping.   Weight bearing as tolerated   Complete by: As directed         Follow-up Information     Gaynelle Arabian, MD. Go on 01/04/2021.   Specialty: Orthopedic Surgery Why: You are scheduled for a follow up appointment on 01-04-21 at 2:00 pm. Contact information: [address] [place] [address] [phone]                  Signed: Theresa Duty 12/22/2020, 12:46  PM

## 2020-12-28 DIAGNOSIS — R262 Difficulty in walking, not elsewhere classified: Secondary | ICD-10-CM | POA: Diagnosis not present

## 2020-12-28 DIAGNOSIS — M25661 Stiffness of right knee, not elsewhere classified: Secondary | ICD-10-CM | POA: Diagnosis not present

## 2020-12-28 DIAGNOSIS — Z96651 Presence of right artificial knee joint: Secondary | ICD-10-CM | POA: Diagnosis not present

## 2020-12-28 DIAGNOSIS — M25561 Pain in right knee: Secondary | ICD-10-CM | POA: Diagnosis not present

## 2020-12-28 DIAGNOSIS — M25461 Effusion, right knee: Secondary | ICD-10-CM | POA: Diagnosis not present

## 2020-12-30 DIAGNOSIS — M25661 Stiffness of right knee, not elsewhere classified: Secondary | ICD-10-CM | POA: Diagnosis not present

## 2020-12-30 DIAGNOSIS — M25561 Pain in right knee: Secondary | ICD-10-CM | POA: Diagnosis not present

## 2020-12-30 DIAGNOSIS — Z96651 Presence of right artificial knee joint: Secondary | ICD-10-CM | POA: Diagnosis not present

## 2020-12-30 DIAGNOSIS — M25461 Effusion, right knee: Secondary | ICD-10-CM | POA: Diagnosis not present

## 2020-12-30 DIAGNOSIS — R262 Difficulty in walking, not elsewhere classified: Secondary | ICD-10-CM | POA: Diagnosis not present

## 2021-01-04 DIAGNOSIS — R262 Difficulty in walking, not elsewhere classified: Secondary | ICD-10-CM | POA: Diagnosis not present

## 2021-01-04 DIAGNOSIS — M25461 Effusion, right knee: Secondary | ICD-10-CM | POA: Diagnosis not present

## 2021-01-04 DIAGNOSIS — Z96651 Presence of right artificial knee joint: Secondary | ICD-10-CM | POA: Diagnosis not present

## 2021-01-04 DIAGNOSIS — M25561 Pain in right knee: Secondary | ICD-10-CM | POA: Diagnosis not present

## 2021-01-04 DIAGNOSIS — M25661 Stiffness of right knee, not elsewhere classified: Secondary | ICD-10-CM | POA: Diagnosis not present

## 2021-01-06 DIAGNOSIS — Z96651 Presence of right artificial knee joint: Secondary | ICD-10-CM | POA: Diagnosis not present

## 2021-01-06 DIAGNOSIS — M25661 Stiffness of right knee, not elsewhere classified: Secondary | ICD-10-CM | POA: Diagnosis not present

## 2021-01-06 DIAGNOSIS — M25461 Effusion, right knee: Secondary | ICD-10-CM | POA: Diagnosis not present

## 2021-01-06 DIAGNOSIS — M25561 Pain in right knee: Secondary | ICD-10-CM | POA: Diagnosis not present

## 2021-01-06 DIAGNOSIS — R262 Difficulty in walking, not elsewhere classified: Secondary | ICD-10-CM | POA: Diagnosis not present

## 2021-01-07 DIAGNOSIS — Z96651 Presence of right artificial knee joint: Secondary | ICD-10-CM | POA: Diagnosis not present

## 2021-01-07 DIAGNOSIS — M25661 Stiffness of right knee, not elsewhere classified: Secondary | ICD-10-CM | POA: Diagnosis not present

## 2021-01-07 DIAGNOSIS — M25561 Pain in right knee: Secondary | ICD-10-CM | POA: Diagnosis not present

## 2021-01-07 DIAGNOSIS — M25461 Effusion, right knee: Secondary | ICD-10-CM | POA: Diagnosis not present

## 2021-01-07 DIAGNOSIS — R262 Difficulty in walking, not elsewhere classified: Secondary | ICD-10-CM | POA: Diagnosis not present

## 2021-01-10 DIAGNOSIS — R262 Difficulty in walking, not elsewhere classified: Secondary | ICD-10-CM | POA: Diagnosis not present

## 2021-01-10 DIAGNOSIS — M25461 Effusion, right knee: Secondary | ICD-10-CM | POA: Diagnosis not present

## 2021-01-10 DIAGNOSIS — Z96651 Presence of right artificial knee joint: Secondary | ICD-10-CM | POA: Diagnosis not present

## 2021-01-10 DIAGNOSIS — M25661 Stiffness of right knee, not elsewhere classified: Secondary | ICD-10-CM | POA: Diagnosis not present

## 2021-01-10 DIAGNOSIS — M25561 Pain in right knee: Secondary | ICD-10-CM | POA: Diagnosis not present

## 2021-01-12 DIAGNOSIS — Z96651 Presence of right artificial knee joint: Secondary | ICD-10-CM | POA: Diagnosis not present

## 2021-01-12 DIAGNOSIS — M25561 Pain in right knee: Secondary | ICD-10-CM | POA: Diagnosis not present

## 2021-01-12 DIAGNOSIS — M25661 Stiffness of right knee, not elsewhere classified: Secondary | ICD-10-CM | POA: Diagnosis not present

## 2021-01-12 DIAGNOSIS — R262 Difficulty in walking, not elsewhere classified: Secondary | ICD-10-CM | POA: Diagnosis not present

## 2021-01-12 DIAGNOSIS — M25461 Effusion, right knee: Secondary | ICD-10-CM | POA: Diagnosis not present

## 2021-01-14 DIAGNOSIS — M25661 Stiffness of right knee, not elsewhere classified: Secondary | ICD-10-CM | POA: Diagnosis not present

## 2021-01-14 DIAGNOSIS — Z96651 Presence of right artificial knee joint: Secondary | ICD-10-CM | POA: Diagnosis not present

## 2021-01-14 DIAGNOSIS — M25561 Pain in right knee: Secondary | ICD-10-CM | POA: Diagnosis not present

## 2021-01-14 DIAGNOSIS — M25461 Effusion, right knee: Secondary | ICD-10-CM | POA: Diagnosis not present

## 2021-01-14 DIAGNOSIS — R262 Difficulty in walking, not elsewhere classified: Secondary | ICD-10-CM | POA: Diagnosis not present

## 2021-01-19 DIAGNOSIS — Z96651 Presence of right artificial knee joint: Secondary | ICD-10-CM | POA: Diagnosis not present

## 2021-01-19 DIAGNOSIS — M25461 Effusion, right knee: Secondary | ICD-10-CM | POA: Diagnosis not present

## 2021-01-19 DIAGNOSIS — R262 Difficulty in walking, not elsewhere classified: Secondary | ICD-10-CM | POA: Diagnosis not present

## 2021-01-19 DIAGNOSIS — M25661 Stiffness of right knee, not elsewhere classified: Secondary | ICD-10-CM | POA: Diagnosis not present

## 2021-01-19 DIAGNOSIS — M25561 Pain in right knee: Secondary | ICD-10-CM | POA: Diagnosis not present

## 2021-01-21 DIAGNOSIS — M25661 Stiffness of right knee, not elsewhere classified: Secondary | ICD-10-CM | POA: Diagnosis not present

## 2021-01-21 DIAGNOSIS — M25461 Effusion, right knee: Secondary | ICD-10-CM | POA: Diagnosis not present

## 2021-01-21 DIAGNOSIS — Z96651 Presence of right artificial knee joint: Secondary | ICD-10-CM | POA: Diagnosis not present

## 2021-01-21 DIAGNOSIS — R262 Difficulty in walking, not elsewhere classified: Secondary | ICD-10-CM | POA: Diagnosis not present

## 2021-01-21 DIAGNOSIS — M25561 Pain in right knee: Secondary | ICD-10-CM | POA: Diagnosis not present

## 2021-01-24 DIAGNOSIS — M25661 Stiffness of right knee, not elsewhere classified: Secondary | ICD-10-CM | POA: Diagnosis not present

## 2021-01-24 DIAGNOSIS — M25561 Pain in right knee: Secondary | ICD-10-CM | POA: Diagnosis not present

## 2021-01-24 DIAGNOSIS — Z96651 Presence of right artificial knee joint: Secondary | ICD-10-CM | POA: Diagnosis not present

## 2021-01-24 DIAGNOSIS — M25461 Effusion, right knee: Secondary | ICD-10-CM | POA: Diagnosis not present

## 2021-01-24 DIAGNOSIS — R262 Difficulty in walking, not elsewhere classified: Secondary | ICD-10-CM | POA: Diagnosis not present

## 2021-01-25 DIAGNOSIS — Z4789 Encounter for other orthopedic aftercare: Secondary | ICD-10-CM | POA: Diagnosis not present

## 2021-01-28 DIAGNOSIS — M25661 Stiffness of right knee, not elsewhere classified: Secondary | ICD-10-CM | POA: Diagnosis not present

## 2021-01-28 DIAGNOSIS — M25561 Pain in right knee: Secondary | ICD-10-CM | POA: Diagnosis not present

## 2021-01-28 DIAGNOSIS — Z96651 Presence of right artificial knee joint: Secondary | ICD-10-CM | POA: Diagnosis not present

## 2021-01-28 DIAGNOSIS — M25461 Effusion, right knee: Secondary | ICD-10-CM | POA: Diagnosis not present

## 2021-01-28 DIAGNOSIS — R262 Difficulty in walking, not elsewhere classified: Secondary | ICD-10-CM | POA: Diagnosis not present

## 2021-01-31 DIAGNOSIS — M25461 Effusion, right knee: Secondary | ICD-10-CM | POA: Diagnosis not present

## 2021-01-31 DIAGNOSIS — M25561 Pain in right knee: Secondary | ICD-10-CM | POA: Diagnosis not present

## 2021-01-31 DIAGNOSIS — M25661 Stiffness of right knee, not elsewhere classified: Secondary | ICD-10-CM | POA: Diagnosis not present

## 2021-01-31 DIAGNOSIS — R262 Difficulty in walking, not elsewhere classified: Secondary | ICD-10-CM | POA: Diagnosis not present

## 2021-01-31 DIAGNOSIS — Z96651 Presence of right artificial knee joint: Secondary | ICD-10-CM | POA: Diagnosis not present

## 2021-02-02 DIAGNOSIS — R262 Difficulty in walking, not elsewhere classified: Secondary | ICD-10-CM | POA: Diagnosis not present

## 2021-02-02 DIAGNOSIS — M25461 Effusion, right knee: Secondary | ICD-10-CM | POA: Diagnosis not present

## 2021-02-02 DIAGNOSIS — Z96651 Presence of right artificial knee joint: Secondary | ICD-10-CM | POA: Diagnosis not present

## 2021-02-02 DIAGNOSIS — M25561 Pain in right knee: Secondary | ICD-10-CM | POA: Diagnosis not present

## 2021-02-02 DIAGNOSIS — M25661 Stiffness of right knee, not elsewhere classified: Secondary | ICD-10-CM | POA: Diagnosis not present

## 2021-02-07 ENCOUNTER — Inpatient Hospital Stay: Payer: Medicare HMO | Attending: Hematology and Oncology | Admitting: Hematology and Oncology

## 2021-02-07 DIAGNOSIS — R262 Difficulty in walking, not elsewhere classified: Secondary | ICD-10-CM | POA: Diagnosis not present

## 2021-02-07 DIAGNOSIS — M25561 Pain in right knee: Secondary | ICD-10-CM | POA: Diagnosis not present

## 2021-02-07 DIAGNOSIS — M25661 Stiffness of right knee, not elsewhere classified: Secondary | ICD-10-CM | POA: Diagnosis not present

## 2021-02-07 DIAGNOSIS — Z96651 Presence of right artificial knee joint: Secondary | ICD-10-CM | POA: Diagnosis not present

## 2021-02-07 DIAGNOSIS — M25461 Effusion, right knee: Secondary | ICD-10-CM | POA: Diagnosis not present

## 2021-02-09 DIAGNOSIS — M25661 Stiffness of right knee, not elsewhere classified: Secondary | ICD-10-CM | POA: Diagnosis not present

## 2021-02-09 DIAGNOSIS — Z96651 Presence of right artificial knee joint: Secondary | ICD-10-CM | POA: Diagnosis not present

## 2021-02-09 DIAGNOSIS — M25561 Pain in right knee: Secondary | ICD-10-CM | POA: Diagnosis not present

## 2021-02-09 DIAGNOSIS — R262 Difficulty in walking, not elsewhere classified: Secondary | ICD-10-CM | POA: Diagnosis not present

## 2021-02-09 DIAGNOSIS — M25461 Effusion, right knee: Secondary | ICD-10-CM | POA: Diagnosis not present

## 2021-02-16 DIAGNOSIS — Z96651 Presence of right artificial knee joint: Secondary | ICD-10-CM | POA: Diagnosis not present

## 2021-02-16 DIAGNOSIS — M25661 Stiffness of right knee, not elsewhere classified: Secondary | ICD-10-CM | POA: Diagnosis not present

## 2021-02-16 DIAGNOSIS — M25561 Pain in right knee: Secondary | ICD-10-CM | POA: Diagnosis not present

## 2021-02-16 DIAGNOSIS — M25461 Effusion, right knee: Secondary | ICD-10-CM | POA: Diagnosis not present

## 2021-02-16 DIAGNOSIS — R262 Difficulty in walking, not elsewhere classified: Secondary | ICD-10-CM | POA: Diagnosis not present

## 2021-02-21 DIAGNOSIS — R262 Difficulty in walking, not elsewhere classified: Secondary | ICD-10-CM | POA: Diagnosis not present

## 2021-02-21 DIAGNOSIS — M25461 Effusion, right knee: Secondary | ICD-10-CM | POA: Diagnosis not present

## 2021-02-21 DIAGNOSIS — M25661 Stiffness of right knee, not elsewhere classified: Secondary | ICD-10-CM | POA: Diagnosis not present

## 2021-02-21 DIAGNOSIS — Z96651 Presence of right artificial knee joint: Secondary | ICD-10-CM | POA: Diagnosis not present

## 2021-02-21 DIAGNOSIS — M25561 Pain in right knee: Secondary | ICD-10-CM | POA: Diagnosis not present

## 2021-02-24 DIAGNOSIS — L608 Other nail disorders: Secondary | ICD-10-CM | POA: Diagnosis not present

## 2021-02-24 DIAGNOSIS — B351 Tinea unguium: Secondary | ICD-10-CM | POA: Diagnosis not present

## 2021-02-24 DIAGNOSIS — L601 Onycholysis: Secondary | ICD-10-CM | POA: Diagnosis not present

## 2021-02-24 DIAGNOSIS — L603 Nail dystrophy: Secondary | ICD-10-CM | POA: Diagnosis not present

## 2021-03-17 DIAGNOSIS — L603 Nail dystrophy: Secondary | ICD-10-CM | POA: Diagnosis not present

## 2021-04-29 DIAGNOSIS — G4700 Insomnia, unspecified: Secondary | ICD-10-CM | POA: Diagnosis not present

## 2021-04-29 DIAGNOSIS — Z0001 Encounter for general adult medical examination with abnormal findings: Secondary | ICD-10-CM | POA: Diagnosis not present

## 2021-04-29 DIAGNOSIS — K219 Gastro-esophageal reflux disease without esophagitis: Secondary | ICD-10-CM | POA: Diagnosis not present

## 2021-04-29 DIAGNOSIS — Z1331 Encounter for screening for depression: Secondary | ICD-10-CM | POA: Diagnosis not present

## 2021-04-29 DIAGNOSIS — E782 Mixed hyperlipidemia: Secondary | ICD-10-CM | POA: Diagnosis not present

## 2021-04-29 DIAGNOSIS — E6609 Other obesity due to excess calories: Secondary | ICD-10-CM | POA: Diagnosis not present

## 2021-04-29 DIAGNOSIS — I10 Essential (primary) hypertension: Secondary | ICD-10-CM | POA: Diagnosis not present

## 2021-04-29 DIAGNOSIS — Z6832 Body mass index (BMI) 32.0-32.9, adult: Secondary | ICD-10-CM | POA: Diagnosis not present

## 2021-05-25 DIAGNOSIS — Z96653 Presence of artificial knee joint, bilateral: Secondary | ICD-10-CM | POA: Diagnosis not present

## 2021-06-22 IMAGING — CT CT L SPINE W/O CM
3 series · 14 of 35 positions shown, 17 images · non-contrast
Comparison: Lumbar MRI 12/07/2019.

CLINICAL DATA: 69-year-old female with pinched nerves in the back.
Pain radiating to the right great toe. Preoperative surgical
planning.

EXAM:
CT LUMBAR SPINE WITHOUT CONTRAST
TECHNIQUE: Multidetector CT imaging of the lumbar spine was performed without
intravenous contrast administration. Multiplanar CT image
reconstructions were also generated.

[Series 4: l spine soft · axial · 0.37mm/px · z∈[+1101,+1261]mm · 6 of 105 slices shown, 8 images]
[im 17/105  soft-tissue]
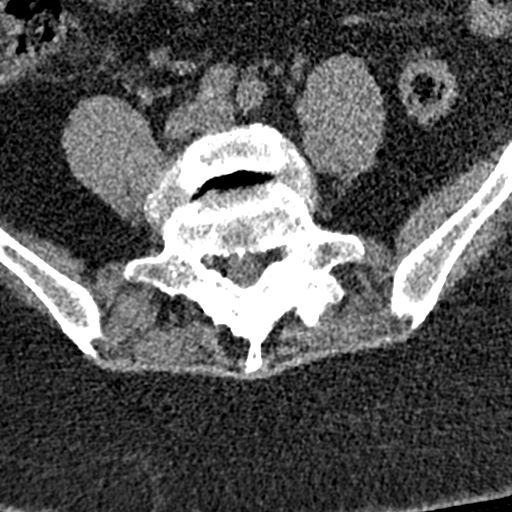
[im 17/105  bone]
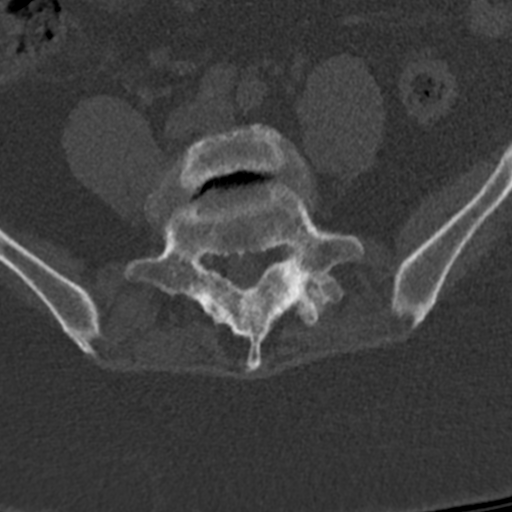
[im 33/105  bone]
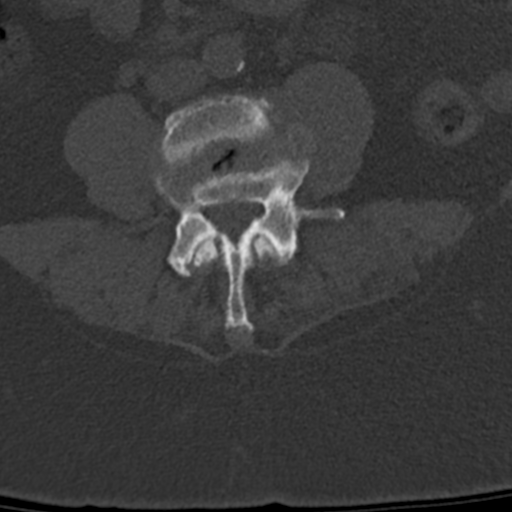
[im 49/105  bone]
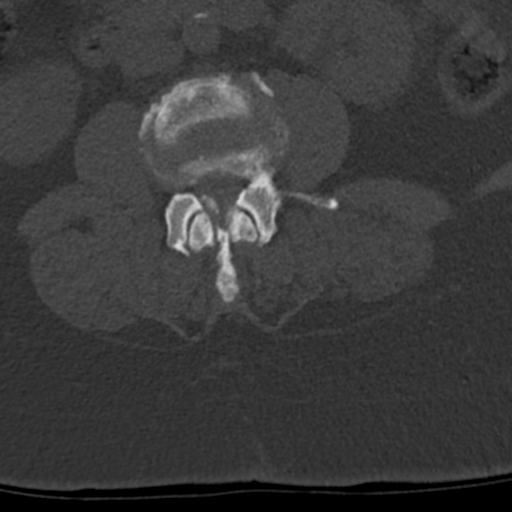
[im 65/105  bone]
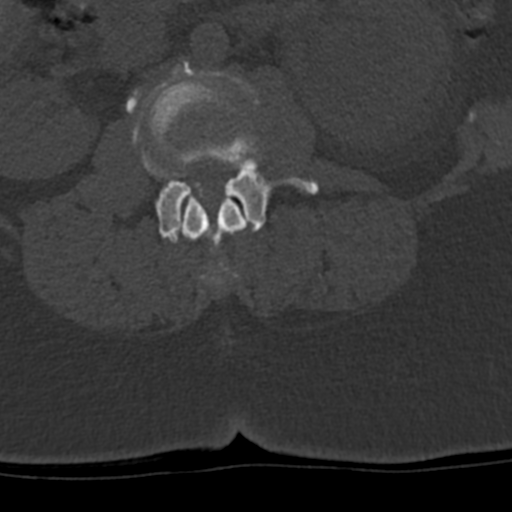
[im 81/105  soft-tissue]
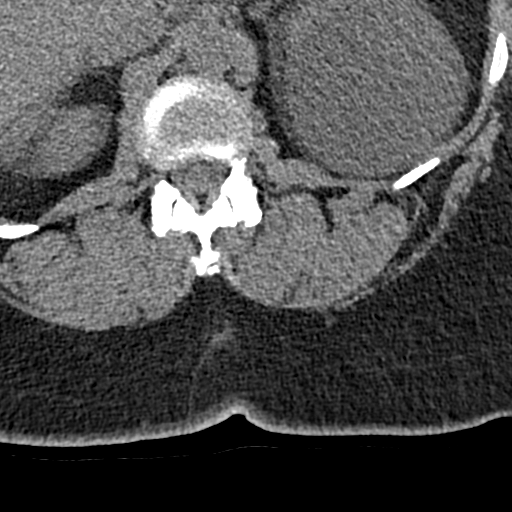
[im 81/105  bone]
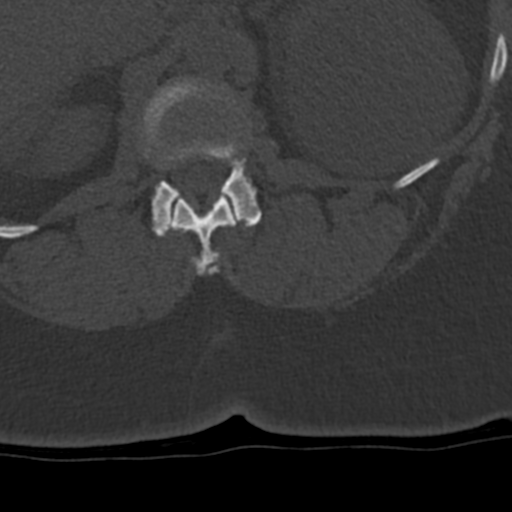
[im 97/105  bone]
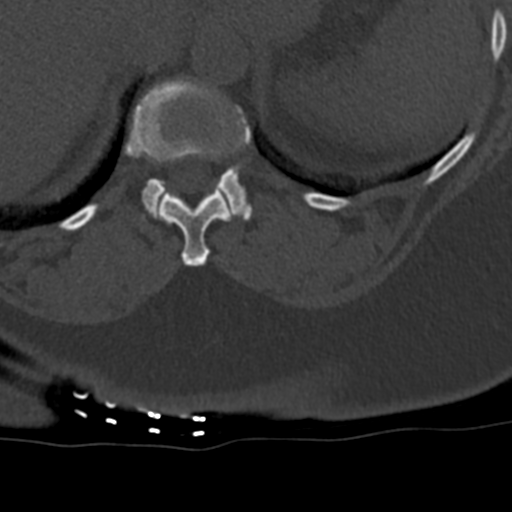

[Series 5: sagittal bone · sagittal · 0.40mm/px · 5 of 137 slices shown, 6 images]
[im 46/137  bone]
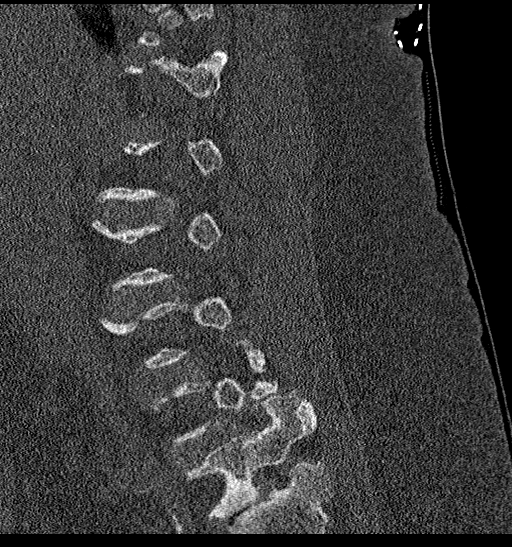
[im 57/137  bone]
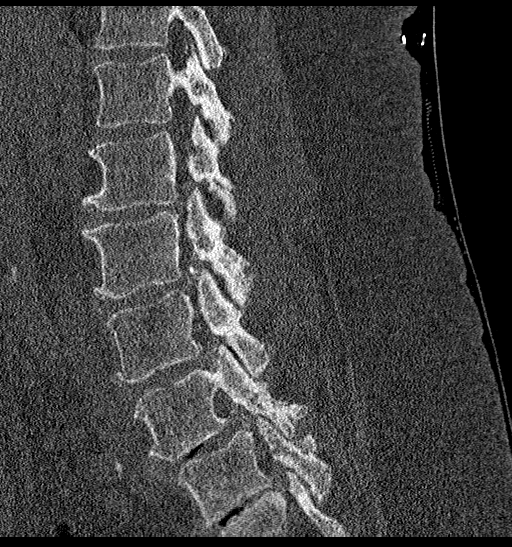
[im 69/137  soft-tissue]
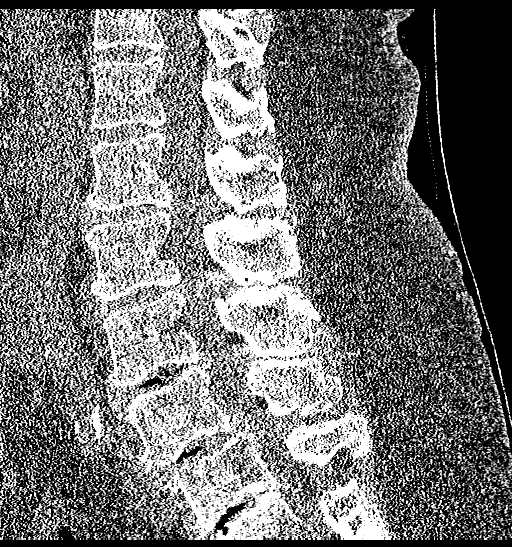
[im 69/137  bone]
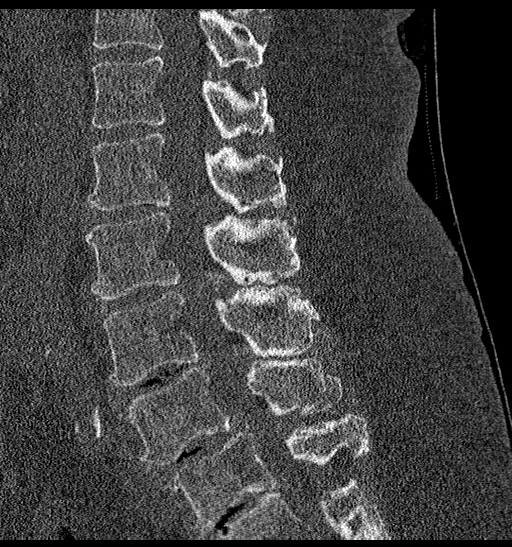
[im 80/137  bone]
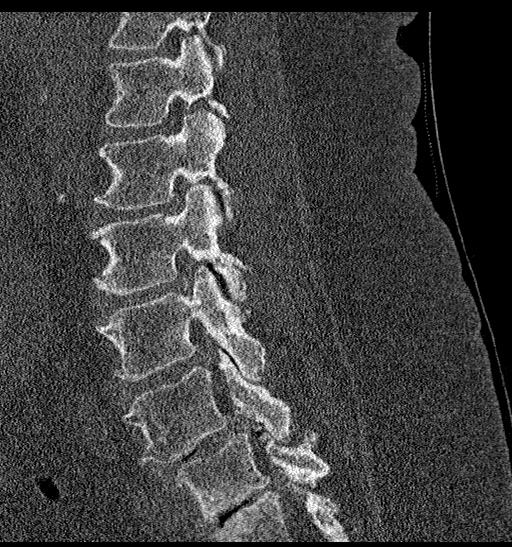
[im 91/137  bone]
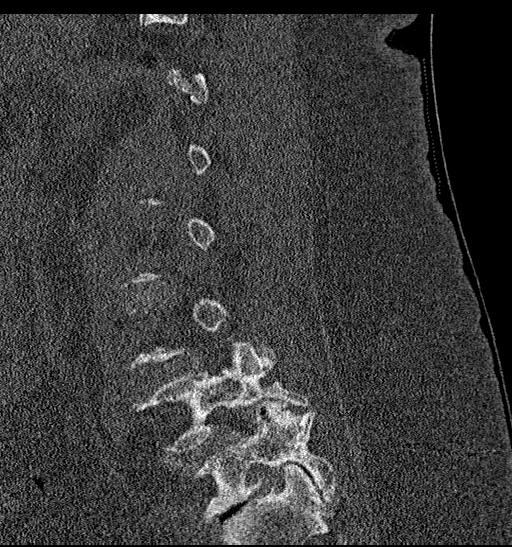

[Series 6: coronal bone · coronal · 0.32mm/px · 3 of 150 slices shown]
[im 30/150  bone]
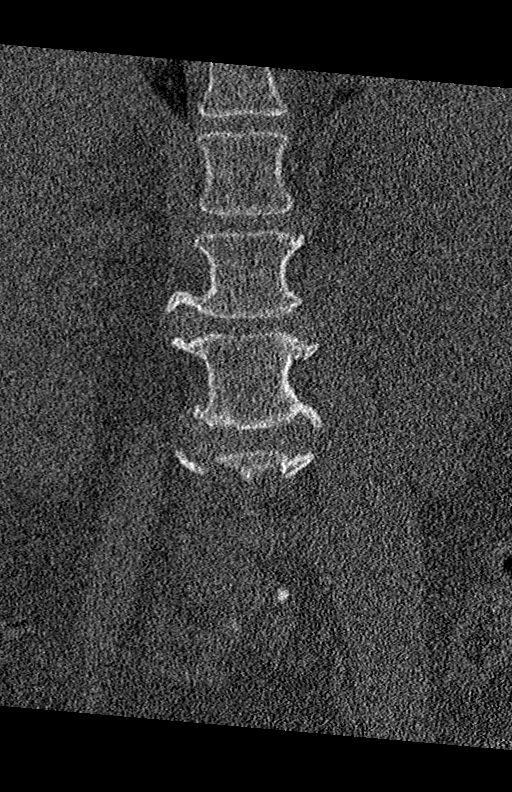
[im 60/150  bone]
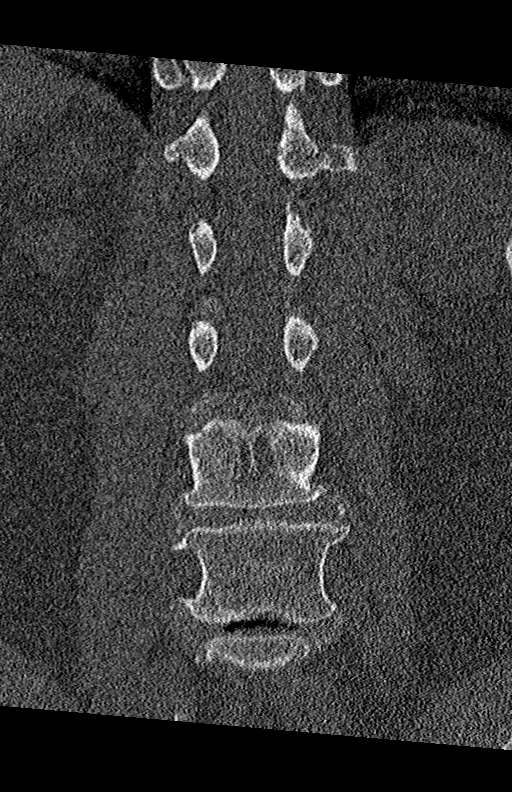
[im 90/150  bone]
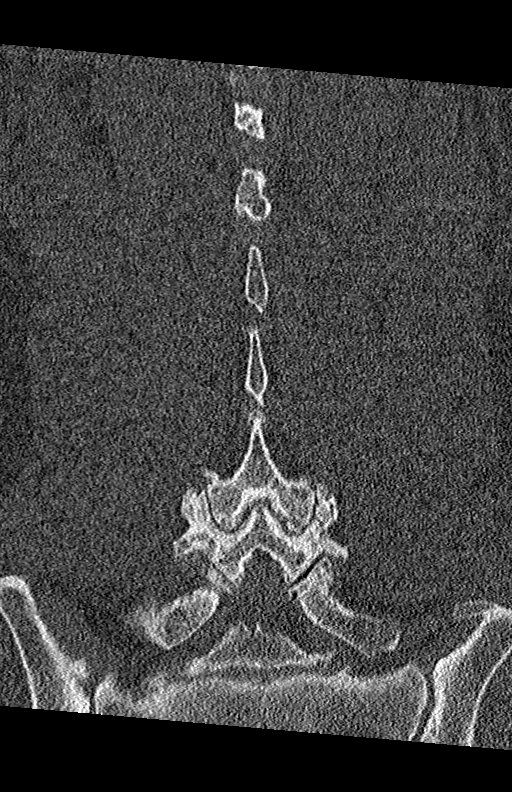

[14 of 35 positions shown; findings below may reference images not displayed]

FINDINGS: Segmentation: Transitional anatomy. Designating normal lumbar
segmentation results in diminutive or absent ribs at the T12 level
which is on series 3, image 17. No prior chest imaging for
confirmation. Correlation with radiographs is recommended prior to
any operative intervention.

Alignment: Stable lumbar lordosis since [DATE]
anterolisthesis of L4 on L5 measures about 4 mm.

Vertebrae: Stable vertebral height and alignment. No acute osseous
abnormality identified. Background bone mineralization appears
normal for age. Visible sacrum and SI joints appear intact.

Paraspinal and other soft tissues: Large 8 cm diameter but simple
fluid density exophytic cyst of the left renal upper pole again
noted. Aortoiliac calcified atherosclerosis. Negative lumbar
paraspinal soft tissues.

Disc levels:

Diffuse lumbar disc space loss and endplate spurring. Prominent
vacuum disc L3-L4 through L5-S1.

Diffuse lumbar facet arthropathy with vacuum facet phenomena from
L1-L2 through L5-S1 and more pronounced on the left.

Subsequent multifactorial spinal stenosis from L1-L2 through L4-L5,
and bilateral neural foraminal stenosis at all levels through L5-S1,
as demonstrated on the [facility] MRI.

No significant change suspected since that time.
IMPRESSION: 1. Transitional anatomy with diminutive or absent ribs at T12 when
designating normal lumbar segmentation. No prior chest imaging for
confirmation. Correlation with radiographs is recommended prior to
any operative intervention.
2. No acute osseous abnormality identified. Stable advanced lumbar
spine degeneration since the [facility] MRI. Diffuse multifactorial
spinal and bilateral foraminal stenosis as demonstrated on the
[DATE]. Aortic Atherosclerosis ([id number]).

## 2021-07-07 ENCOUNTER — Other Ambulatory Visit (HOSPITAL_COMMUNITY): Payer: Self-pay | Admitting: Family Medicine

## 2021-07-07 DIAGNOSIS — R921 Mammographic calcification found on diagnostic imaging of breast: Secondary | ICD-10-CM

## 2021-08-02 ENCOUNTER — Ambulatory Visit (HOSPITAL_COMMUNITY)
Admission: RE | Admit: 2021-08-02 | Discharge: 2021-08-02 | Disposition: A | Discharge status: Home / Self Care | Payer: Medicare HMO | Source: Ambulatory Visit | Attending: Family Medicine | Admitting: Family Medicine

## 2021-08-02 DIAGNOSIS — R921 Mammographic calcification found on diagnostic imaging of breast: Secondary | ICD-10-CM | POA: Insufficient documentation

## 2021-08-02 DIAGNOSIS — R928 Other abnormal and inconclusive findings on diagnostic imaging of breast: Secondary | ICD-10-CM | POA: Diagnosis not present

## 2021-08-18 DIAGNOSIS — R140 Abdominal distension (gaseous): Secondary | ICD-10-CM | POA: Diagnosis not present

## 2021-08-18 DIAGNOSIS — Z6834 Body mass index (BMI) 34.0-34.9, adult: Secondary | ICD-10-CM | POA: Diagnosis not present

## 2021-08-18 DIAGNOSIS — R921 Mammographic calcification found on diagnostic imaging of breast: Secondary | ICD-10-CM | POA: Diagnosis not present

## 2021-08-19 ENCOUNTER — Other Ambulatory Visit: Payer: Self-pay | Admitting: Family Medicine

## 2021-08-19 DIAGNOSIS — R921 Mammographic calcification found on diagnostic imaging of breast: Secondary | ICD-10-CM

## 2022-02-03 ENCOUNTER — Other Ambulatory Visit: Payer: Self-pay | Admitting: Family Medicine

## 2022-02-03 ENCOUNTER — Ambulatory Visit
Admission: RE | Admit: 2022-02-03 | Discharge: 2022-02-03 | Disposition: A | Discharge status: Home / Self Care | Payer: Medicare HMO | Source: Ambulatory Visit | Attending: Family Medicine | Admitting: Family Medicine

## 2022-02-03 DIAGNOSIS — R921 Mammographic calcification found on diagnostic imaging of breast: Secondary | ICD-10-CM

## 2022-02-07 DIAGNOSIS — Z96651 Presence of right artificial knee joint: Secondary | ICD-10-CM | POA: Diagnosis not present

## 2022-02-16 DIAGNOSIS — E6609 Other obesity due to excess calories: Secondary | ICD-10-CM | POA: Diagnosis not present

## 2022-02-16 DIAGNOSIS — R921 Mammographic calcification found on diagnostic imaging of breast: Secondary | ICD-10-CM | POA: Diagnosis not present

## 2022-02-28 DIAGNOSIS — H401131 Primary open-angle glaucoma, bilateral, mild stage: Secondary | ICD-10-CM | POA: Diagnosis not present

## 2022-02-28 DIAGNOSIS — H524 Presbyopia: Secondary | ICD-10-CM | POA: Diagnosis not present

## 2022-03-06 DIAGNOSIS — Z803 Family history of malignant neoplasm of breast: Secondary | ICD-10-CM | POA: Diagnosis not present

## 2022-03-06 DIAGNOSIS — N6091 Unspecified benign mammary dysplasia of right breast: Secondary | ICD-10-CM | POA: Diagnosis not present

## 2022-03-09 ENCOUNTER — Telehealth: Payer: Self-pay | Admitting: Genetic Counselor

## 2022-03-09 ENCOUNTER — Other Ambulatory Visit: Payer: Self-pay | Admitting: General Surgery

## 2022-03-09 DIAGNOSIS — N6091 Unspecified benign mammary dysplasia of right breast: Secondary | ICD-10-CM

## 2022-03-09 NOTE — Telephone Encounter (Signed)
Scheduled appt per 3/5 referral. Pt is aware of appt date and time. Pt is aware to arrive 15 mins prior to appt time and to bring and updated insurance card. Pt is aware of appt location.

## 2022-03-10 ENCOUNTER — Other Ambulatory Visit: Payer: Self-pay | Admitting: General Surgery

## 2022-03-10 DIAGNOSIS — N6091 Unspecified benign mammary dysplasia of right breast: Secondary | ICD-10-CM

## 2022-03-28 ENCOUNTER — Other Ambulatory Visit: Payer: Self-pay

## 2022-03-28 ENCOUNTER — Encounter (HOSPITAL_BASED_OUTPATIENT_CLINIC_OR_DEPARTMENT_OTHER): Payer: Self-pay | Admitting: General Surgery

## 2022-03-30 DIAGNOSIS — E6609 Other obesity due to excess calories: Secondary | ICD-10-CM | POA: Diagnosis not present

## 2022-03-30 DIAGNOSIS — Z6835 Body mass index (BMI) 35.0-35.9, adult: Secondary | ICD-10-CM | POA: Diagnosis not present

## 2022-03-30 DIAGNOSIS — R921 Mammographic calcification found on diagnostic imaging of breast: Secondary | ICD-10-CM | POA: Diagnosis not present

## 2022-03-30 DIAGNOSIS — I4510 Unspecified right bundle-branch block: Secondary | ICD-10-CM | POA: Diagnosis not present

## 2022-03-30 DIAGNOSIS — I440 Atrioventricular block, first degree: Secondary | ICD-10-CM | POA: Diagnosis not present

## 2022-03-31 ENCOUNTER — Ambulatory Visit
Admission: RE | Admit: 2022-03-31 | Discharge: 2022-03-31 | Disposition: A | Discharge status: Home / Self Care | Payer: Medicare HMO | Source: Ambulatory Visit | Attending: General Surgery | Admitting: General Surgery

## 2022-03-31 DIAGNOSIS — N6091 Unspecified benign mammary dysplasia of right breast: Secondary | ICD-10-CM

## 2022-03-31 DIAGNOSIS — R928 Other abnormal and inconclusive findings on diagnostic imaging of breast: Secondary | ICD-10-CM | POA: Diagnosis not present

## 2022-03-31 HISTORY — PX: BREAST BIOPSY: SHX20

## 2022-04-04 ENCOUNTER — Encounter (HOSPITAL_BASED_OUTPATIENT_CLINIC_OR_DEPARTMENT_OTHER): Payer: Self-pay | Admitting: General Surgery

## 2022-04-04 ENCOUNTER — Ambulatory Visit (HOSPITAL_BASED_OUTPATIENT_CLINIC_OR_DEPARTMENT_OTHER): Payer: Medicare HMO | Admitting: Certified Registered"

## 2022-04-04 ENCOUNTER — Ambulatory Visit
Admission: RE | Admit: 2022-04-04 | Discharge: 2022-04-04 | Disposition: A | Discharge status: Home / Self Care | Payer: Medicare HMO | Source: Ambulatory Visit | Attending: General Surgery | Admitting: General Surgery

## 2022-04-04 ENCOUNTER — Other Ambulatory Visit: Payer: Self-pay

## 2022-04-04 ENCOUNTER — Ambulatory Visit (HOSPITAL_BASED_OUTPATIENT_CLINIC_OR_DEPARTMENT_OTHER)
Admission: RE | Admit: 2022-04-04 | Discharge: 2022-04-04 | Disposition: A | Discharge status: Home / Self Care | Payer: Medicare HMO | Attending: General Surgery | Admitting: General Surgery

## 2022-04-04 ENCOUNTER — Encounter (HOSPITAL_BASED_OUTPATIENT_CLINIC_OR_DEPARTMENT_OTHER)
Admission: RE | Disposition: A | Discharge status: Home / Self Care | Payer: Self-pay | Source: Home / Self Care | Attending: General Surgery

## 2022-04-04 DIAGNOSIS — Z803 Family history of malignant neoplasm of breast: Secondary | ICD-10-CM | POA: Insufficient documentation

## 2022-04-04 DIAGNOSIS — K219 Gastro-esophageal reflux disease without esophagitis: Secondary | ICD-10-CM | POA: Diagnosis not present

## 2022-04-04 DIAGNOSIS — Z6834 Body mass index (BMI) 34.0-34.9, adult: Secondary | ICD-10-CM | POA: Insufficient documentation

## 2022-04-04 DIAGNOSIS — E669 Obesity, unspecified: Secondary | ICD-10-CM | POA: Insufficient documentation

## 2022-04-04 DIAGNOSIS — Z808 Family history of malignant neoplasm of other organs or systems: Secondary | ICD-10-CM | POA: Insufficient documentation

## 2022-04-04 DIAGNOSIS — N6091 Unspecified benign mammary dysplasia of right breast: Secondary | ICD-10-CM | POA: Insufficient documentation

## 2022-04-04 DIAGNOSIS — N6011 Diffuse cystic mastopathy of right breast: Secondary | ICD-10-CM | POA: Insufficient documentation

## 2022-04-04 DIAGNOSIS — N6081 Other benign mammary dysplasias of right breast: Secondary | ICD-10-CM | POA: Diagnosis not present

## 2022-04-04 DIAGNOSIS — N62 Hypertrophy of breast: Secondary | ICD-10-CM

## 2022-04-04 DIAGNOSIS — I10 Essential (primary) hypertension: Secondary | ICD-10-CM | POA: Diagnosis not present

## 2022-04-04 DIAGNOSIS — N6021 Fibroadenosis of right breast: Secondary | ICD-10-CM | POA: Diagnosis not present

## 2022-04-04 DIAGNOSIS — Z01818 Encounter for other preprocedural examination: Secondary | ICD-10-CM

## 2022-04-04 DIAGNOSIS — R928 Other abnormal and inconclusive findings on diagnostic imaging of breast: Secondary | ICD-10-CM | POA: Diagnosis not present

## 2022-04-04 HISTORY — PX: RADIOACTIVE SEED GUIDED EXCISIONAL BREAST BIOPSY: SHX6490

## 2022-04-04 HISTORY — PX: BREAST EXCISIONAL BIOPSY: SUR124

## 2022-04-04 SURGERY — RADIOACTIVE SEED GUIDED BREAST BIOPSY
Anesthesia: General | Site: Breast | Laterality: Right

## 2022-04-04 MED ORDER — LIDOCAINE 2% (20 MG/ML) 5 ML SYRINGE
INTRAMUSCULAR | Status: AC
  Filled 2022-04-04: qty 5

## 2022-04-04 MED ORDER — KETOROLAC TROMETHAMINE 15 MG/ML IJ SOLN: 15.0000 mg | Freq: Once | INTRAMUSCULAR | Status: DC | PRN

## 2022-04-04 MED ORDER — ONDANSETRON HCL 4 MG/2ML IJ SOLN
INTRAMUSCULAR | Status: DC | PRN
  Administered 2022-04-04: 4 mg via INTRAVENOUS

## 2022-04-04 MED ORDER — ENSURE PRE-SURGERY PO LIQD: 296.0000 mL | Freq: Once | ORAL | Status: DC

## 2022-04-04 MED ORDER — PROPOFOL 10 MG/ML IV BOLUS
INTRAVENOUS | Status: DC | PRN
  Administered 2022-04-04: 200 mg via INTRAVENOUS

## 2022-04-04 MED ORDER — ONDANSETRON HCL 4 MG/2ML IJ SOLN
INTRAMUSCULAR | Status: AC
  Filled 2022-04-04: qty 2

## 2022-04-04 MED ORDER — MIDAZOLAM HCL 2 MG/2ML IJ SOLN
INTRAMUSCULAR | Status: AC
  Filled 2022-04-04: qty 2

## 2022-04-04 MED ORDER — CIPROFLOXACIN IN D5W 400 MG/200ML IV SOLN
INTRAVENOUS | Status: AC
  Filled 2022-04-04: qty 200

## 2022-04-04 MED ORDER — BUPIVACAINE HCL (PF) 0.25 % IJ SOLN
INTRAMUSCULAR | Status: DC | PRN
  Administered 2022-04-04: 10 mL

## 2022-04-04 MED ORDER — EPHEDRINE SULFATE (PRESSORS) 50 MG/ML IJ SOLN
INTRAMUSCULAR | Status: DC | PRN
  Administered 2022-04-04 (×2): 5 mg via INTRAVENOUS

## 2022-04-04 MED ORDER — FENTANYL CITRATE (PF) 100 MCG/2ML IJ SOLN: 25.0000 ug | INTRAMUSCULAR | Status: DC | PRN

## 2022-04-04 MED ORDER — DEXAMETHASONE SODIUM PHOSPHATE 10 MG/ML IJ SOLN
INTRAMUSCULAR | Status: AC
  Filled 2022-04-04: qty 1

## 2022-04-04 MED ORDER — ONDANSETRON HCL 4 MG/2ML IJ SOLN: 4.0000 mg | Freq: Once | INTRAMUSCULAR | Status: DC | PRN

## 2022-04-04 MED ORDER — LACTATED RINGERS IV SOLN
INTRAVENOUS | Status: DC
Start: 2022-04-04 — End: 2022-04-04

## 2022-04-04 MED ORDER — FENTANYL CITRATE (PF) 100 MCG/2ML IJ SOLN
INTRAMUSCULAR | Status: AC
  Filled 2022-04-04: qty 2

## 2022-04-04 MED ORDER — AMISULPRIDE (ANTIEMETIC) 5 MG/2ML IV SOLN: 10.0000 mg | Freq: Once | INTRAVENOUS | Status: DC | PRN

## 2022-04-04 MED ORDER — DEXAMETHASONE SODIUM PHOSPHATE 10 MG/ML IJ SOLN
INTRAMUSCULAR | Status: DC | PRN
  Administered 2022-04-04: 8 mg via INTRAVENOUS

## 2022-04-04 MED ORDER — PROPOFOL 500 MG/50ML IV EMUL
INTRAVENOUS | Status: DC | PRN
  Administered 2022-04-04: 200 ug/kg/min via INTRAVENOUS

## 2022-04-04 MED ORDER — EPHEDRINE 5 MG/ML INJ
INTRAVENOUS | Status: AC
  Filled 2022-04-04: qty 5

## 2022-04-04 MED ORDER — ACETAMINOPHEN 500 MG PO TABS
1000.0000 mg | ORAL_TABLET | ORAL | Status: AC
  Administered 2022-04-04: 1000 mg via ORAL

## 2022-04-04 MED ORDER — FENTANYL CITRATE (PF) 100 MCG/2ML IJ SOLN
INTRAMUSCULAR | Status: DC | PRN
  Administered 2022-04-04: 25 ug via INTRAVENOUS

## 2022-04-04 MED ORDER — CIPROFLOXACIN IN D5W 400 MG/200ML IV SOLN
400.0000 mg | INTRAVENOUS | Status: AC
  Administered 2022-04-04: 400 mg via INTRAVENOUS

## 2022-04-04 MED ORDER — LIDOCAINE HCL (CARDIAC) PF 100 MG/5ML IV SOSY
PREFILLED_SYRINGE | INTRAVENOUS | Status: DC | PRN
  Administered 2022-04-04: 60 mg via INTRAVENOUS

## 2022-04-04 MED ORDER — MIDAZOLAM HCL 5 MG/5ML IJ SOLN
INTRAMUSCULAR | Status: DC | PRN
  Administered 2022-04-04: 2 mg via INTRAVENOUS

## 2022-04-04 MED ORDER — ACETAMINOPHEN 500 MG PO TABS
ORAL_TABLET | ORAL | Status: AC
  Filled 2022-04-04: qty 2

## 2022-04-04 SURGICAL SUPPLY — 57 items
ADH SKN CLS APL DERMABOND .7 (GAUZE/BANDAGES/DRESSINGS) ×1
APL PRP STRL LF DISP 70% ISPRP (MISCELLANEOUS) ×1
APPLIER CLIP 9.375 MED OPEN (MISCELLANEOUS)
APR CLP MED 9.3 20 MLT OPN (MISCELLANEOUS)
BINDER BREAST LRG (GAUZE/BANDAGES/DRESSINGS) IMPLANT
BINDER BREAST MEDIUM (GAUZE/BANDAGES/DRESSINGS) IMPLANT
BINDER BREAST XLRG (GAUZE/BANDAGES/DRESSINGS) IMPLANT
BINDER BREAST XXLRG (GAUZE/BANDAGES/DRESSINGS) IMPLANT
BLADE SURG 15 STRL LF DISP TIS (BLADE) ×1 IMPLANT
BLADE SURG 15 STRL SS (BLADE) ×1
CANISTER SUC SOCK COL 7IN (MISCELLANEOUS) IMPLANT
CANISTER SUCT 1200ML W/VALVE (MISCELLANEOUS) IMPLANT
CHLORAPREP W/TINT 26 (MISCELLANEOUS) ×1 IMPLANT
CLIP APPLIE 9.375 MED OPEN (MISCELLANEOUS) IMPLANT
CLIP TI WIDE RED SMALL 6 (CLIP) IMPLANT
COVER BACK TABLE 60X90IN (DRAPES) ×1 IMPLANT
COVER MAYO STAND STRL (DRAPES) ×1 IMPLANT
COVER PROBE CYLINDRICAL 5X96 (MISCELLANEOUS) ×1 IMPLANT
DERMABOND ADVANCED .7 DNX12 (GAUZE/BANDAGES/DRESSINGS) ×1 IMPLANT
DRAPE LAPAROSCOPIC ABDOMINAL (DRAPES) ×1 IMPLANT
DRAPE UTILITY XL STRL (DRAPES) ×1 IMPLANT
DRSG TEGADERM 4X4.75 (GAUZE/BANDAGES/DRESSINGS) IMPLANT
ELECT COATED BLADE 2.86 ST (ELECTRODE) ×1 IMPLANT
ELECT REM PT RETURN 9FT ADLT (ELECTROSURGICAL) ×1
ELECTRODE REM PT RTRN 9FT ADLT (ELECTROSURGICAL) ×1 IMPLANT
GAUZE SPONGE 4X4 12PLY STRL LF (GAUZE/BANDAGES/DRESSINGS) IMPLANT
GLOVE BIO SURGEON STRL SZ7 (GLOVE) ×2 IMPLANT
GLOVE BIOGEL PI IND STRL 6.5 (GLOVE) IMPLANT
GLOVE BIOGEL PI IND STRL 7.0 (GLOVE) IMPLANT
GLOVE BIOGEL PI IND STRL 7.5 (GLOVE) ×1 IMPLANT
GLOVE ECLIPSE 6.5 STRL STRAW (GLOVE) IMPLANT
GOWN STRL REUS W/ TWL LRG LVL3 (GOWN DISPOSABLE) ×2 IMPLANT
GOWN STRL REUS W/TWL LRG LVL3 (GOWN DISPOSABLE) ×2
HEMOSTAT ARISTA ABSORB 3G PWDR (HEMOSTASIS) IMPLANT
KIT MARKER MARGIN INK (KITS) ×1 IMPLANT
NDL HYPO 25X1 1.5 SAFETY (NEEDLE) ×1 IMPLANT
NEEDLE HYPO 25X1 1.5 SAFETY (NEEDLE) ×1 IMPLANT
NS IRRIG 1000ML POUR BTL (IV SOLUTION) IMPLANT
PACK BASIN DAY SURGERY FS (CUSTOM PROCEDURE TRAY) ×1 IMPLANT
PENCIL SMOKE EVACUATOR (MISCELLANEOUS) ×1 IMPLANT
RETRACTOR ONETRAX LX 90X20 (MISCELLANEOUS) IMPLANT
SLEEVE SCD COMPRESS KNEE MED (STOCKING) ×1 IMPLANT
SPIKE FLUID TRANSFER (MISCELLANEOUS) IMPLANT
SPONGE T-LAP 4X18 ~~LOC~~+RFID (SPONGE) ×1 IMPLANT
STRIP CLOSURE SKIN 1/2X4 (GAUZE/BANDAGES/DRESSINGS) ×1 IMPLANT
SUT MNCRL AB 4-0 PS2 18 (SUTURE) IMPLANT
SUT MON AB 5-0 PS2 18 (SUTURE) IMPLANT
SUT SILK 2 0 SH (SUTURE) IMPLANT
SUT VIC AB 2-0 SH 27 (SUTURE) ×2
SUT VIC AB 2-0 SH 27XBRD (SUTURE) ×1 IMPLANT
SUT VIC AB 3-0 SH 27 (SUTURE) ×1
SUT VIC AB 3-0 SH 27X BRD (SUTURE) ×1 IMPLANT
SYR CONTROL 10ML LL (SYRINGE) ×1 IMPLANT
TOWEL GREEN STERILE FF (TOWEL DISPOSABLE) ×1 IMPLANT
TRAY FAXITRON CT DISP (TRAY / TRAY PROCEDURE) ×1 IMPLANT
TUBE CONNECTING 20X1/4 (TUBING) IMPLANT
YANKAUER SUCT BULB TIP NO VENT (SUCTIONS) IMPLANT

## 2022-04-04 NOTE — Transfer of Care (Signed)
Immediate Anesthesia Transfer of Care Note  Patient: Amy Newman  Procedure(s) Performed: RIGHT BREAST SEED GUIDED EXCISIONAL BIOPSY (Right: Breast)  Patient Location: PACU  Anesthesia Type:General  Level of Consciousness: drowsy  Airway & Oxygen Therapy: Patient Spontanous Breathing and Patient connected to face mask oxygen  Post-op Assessment: Report given to RN and Post -op Vital signs reviewed and stable  Post vital signs: Reviewed and stable  Last Vitals:  Vitals Value Taken Time  BP 150/70 04/04/22 1115  Temp    Pulse 75 04/04/22 1116  Resp 14 04/04/22 1116  SpO2 99 % 04/04/22 1116  Vitals shown include unvalidated device data.  Last Pain:  Vitals:   04/04/22 0939  TempSrc: Temporal  PainSc: 0-No pain      Patients Stated Pain Goal: 2 ([id number])  Complications: No notable events documented.

## 2022-04-04 NOTE — Op Note (Signed)
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 Serita Grammes Anesthesia: General Specimens: Right breast tissue containing seed and clip to pathology marked with paint Estimated blood loss: Minimal Drains: None Complications: Sponge needle count was correct completion Disposition to recovery room in stable condition  Indications: This is a 72 year old female who comes to see me for second opinion.  She had a mammogram in May 2022 that showed calcifications.  These measured 3.8 x 2.4 x 1.1 cm.  These were biopsied and showed atypical ductal hyperplasia.  She has been followed since then.  In February 2024 she still had an area of 3.4 x 1.7 x 2.2 cm of suspicious calcifications and was referred back for a second opinion.  We discussed all of her options and due to the atypical ductal hyperplasia I recommended to her that we proceed with an excisional biopsy.  She was agreeable to this.  Procedure: After informed consent was obtained the patient was taken to the operating room.  She had a seed placed prior to beginning.  I had these mammograms available for my review in the operating room.  She was then placed under general anesthesia without complication.  She was prepped and draped in the standard sterile surgical fashion.  Surgical timeout was then performed.  I located the seed in the lower inner quadrant.  I made an inframammary incision after infiltrating Marcaine to hide the scar later.  I dissected to the seed with a guide of the neoprobe.  I then remove the seed and some of the surrounding tissue due to the calcifications that were present.  Mammogram confirmed removal of the seed and the clip.  The specimen was marked with paint for pathologic review.  I then obtained hemostasis.  I closed down the breast tissue with 2-0 Vicryl.  Skin was  closed with 3-0 Vicryl for Monocryl.  Glue and Steri-Strips were applied.  She tolerated this well was extubated and transferred recovery stable.

## 2022-04-04 NOTE — Anesthesia Procedure Notes (Signed)
Procedure Name: LMA Insertion Date/Time: 04/04/2022 10:28 AM  Performed by: Lavonia Dana, CRNAPre-anesthesia Checklist: Patient identified, Emergency Drugs available, Suction available and Patient being monitored Patient Re-evaluated:Patient Re-evaluated prior to induction Oxygen Delivery Method: Circle system utilized Preoxygenation: Pre-oxygenation with 100% oxygen Induction Type: IV induction Ventilation: Mask ventilation without difficulty LMA: LMA inserted LMA Size: 4.0 Number of attempts: 1 Airway Equipment and Method: Bite block Placement Confirmation: positive ETCO2 Tube secured with: Tape Dental Injury: Teeth and Oropharynx as per pre-operative assessment

## 2022-04-04 NOTE — H&P (Signed)
72 year old female who was previously seen by one of my partners. She has a family history of breast cancer in a sister and an unknown age., Brother with prostate cancer, and an aunt with pancreatic cancer. She underwent a mammogram in May 2022 that showed these calcifications initially. Her breast density is C. She had a 3.8 x 2.4 x 1.1 cm area of calcifications noted. This underwent a biopsy in 2022 with the finding of atypical ductal hyperplasia with calcifications. This has been followed since then. In August 2023 she had stable right breast postbiopsy changes and the calcifications present. She underwent additional follow-up. This in February 2024 showed her to have again 3.4 x 1.7 x 2.2 cm of suspicious calcifications. She was referred back and desired a second opinion. She has no mass or discharge on her own exam. She is retired.  Review of Systems: A complete review of systems was obtained from the patient. I have reviewed this information and discussed as appropriate with the patient. See HPI as well for other ROS.  Review of Systems All other systems reviewed and are negative.   Medical History: Past Medical History: Diagnosis Date Arthritis Glaucoma (increased eye pressure)  Patient Active Problem List Diagnosis Atypical ductal hyperplasia of right breast  Past Surgical History: Procedure Laterality Date CESAREAN SECTION JOINT REPLACEMENT   Allergies Allergen Reactions Penicillins Rash  Current Outpatient Medications on File Prior to Visit Medication Sig Dispense Refill atenoloL (TENORMIN) 50 MG tablet Take 50 mg by mouth once daily atorvastatin (LIPITOR) 20 MG tablet Take 20 mg by mouth once daily brimonidine (ALPHAGAN) 0.2 % ophthalmic solution Place 1 drop into both eyes 2 (two) times daily carBAMazepine (TEGRETOL) 200 mg tablet TAKE HALF A TABLET BY MOUTH TWICE A DAY FOR 1 WEEK, THEN TAKE ONE TABLET BY MOUTH TWICE A DAY diclofenac potassium (CATAFLAM) 50 mg tablet  TAKE 1 TABLET TWICE A DAY AS NEEDED FOR MODERATE PAIN dorzolamide-timoloL (COSOPT) 22.3-6.8 mg/mL ophthalmic solution Place 1 drop into the left eye 2 (two) times daily gabapentin (NEURONTIN) 300 MG capsule Take 300 mg by mouth 2 (two) times daily HYDROcodone-acetaminophen (NORCO) 5-325 mg tablet TAKE 1-2 TABLETS BY MOUTH EVERY 6 HOURS AS NEEDED FOR PAIN ibuprofen (MOTRIN) 800 MG tablet Take 800 mg by mouth 3 (three) times daily with meals latanoprost (XALATAN) 0.005 % ophthalmic solution 1 DROP INTO BOTH EYES IN THE EVENING lidocaine (LIDODERM) 5 % patch Place onto the skin meclizine (ANTIVERT) 25 mg tablet TAKE 1 TABLET BY MOUTH EVERY 6 HOURS FOR 10 DAYS methylPREDNISolone (MEDROL DOSEPACK) 4 mg tablet TAKE 6 TABLETS ON DAY 1 AS DIRECTED ON PACKAGE AND DECREASE BY 1 TAB EACH DAY FOR A TOTAL OF 6 DAYS omeprazole (PRILOSEC) 20 MG DR capsule Take 20 mg by mouth once daily predniSONE (DELTASONE) 10 MG tablet TAKE 6 TABLETS ON DAY 1 AS DIRECTED ON PACKAGE AND DECREASE BY 1 TAB EACH DAY FOR A TOTAL OF 6 DAYS topiramate (TOPAMAX) 25 MG tablet Take 25 mg by mouth once daily traMADoL (ULTRAM) 50 mg tablet TAKE 1-2 TABLETS EVERY 8 HOURS AS NEEDED valACYclovir (VALTREX) 1000 MG tablet TAKE 1 TABLET (1,000 MG TOTAL) BY MOUTH 3 (THREE) TIMES DAILY FOR 10 DAYS.   Family History Problem Relation Age of Onset Skin cancer Mother Coronary Artery Disease (Blocked arteries around heart) Mother Diabetes Mother High blood pressure (Hypertension) Sister Breast cancer Sister High blood pressure (Hypertension) Brother Diabetes Brother   Social History  Tobacco Use Smoking Status Never Smokeless Tobacco Never Marital status:  Single Tobacco Use Smoking status: Never Smokeless tobacco: Never Substance and Sexual Activity Alcohol use: Never Drug use: Never  Objective:  Vitals: 03/06/22 0936 BP: 128/82 Pulse: 80 Weight: (!) 106.9 kg (235 lb 9.6 oz)  Body mass index is 35.82 kg/m.  Physical  Exam Vitals reviewed. Constitutional: Appearance: Normal appearance. Chest: Breasts: Right: No inverted nipple, mass or nipple discharge. Left: No inverted nipple, mass or nipple discharge. Lymphadenopathy: Upper Body: Right upper body: No supraclavicular or axillary adenopathy. Left upper body: No supraclavicular or axillary adenopathy. Neurological: Mental Status: She is alert.   Assessment and Plan:  Atypical ductal hyperplasia of right breast   Right breast radioactive seed guided excisional biopsy  I discussed with her having her go see one of the genetics counselors given her family history especially of pancreatic cancer and a sister with breast cancer. She is agreeable to do this.  We also discussed proceeding with a right breast radioactive seed guided excisional biopsy of these calcifications. It has been sometime since her initial biopsy. The calcifications certainly are still present. With ADH and her family history I do not think it is good idea just to follow these. We discussed the rationale for surgery. We discussed surgery, risks, and recovery and we will proceed.

## 2022-04-04 NOTE — Discharge Instructions (Addendum)
[place] Office Phone Number [phone]  POST OP INSTRUCTIONS Take 400 mg of ibuprofen every 8 hours or 650 mg tylenol every 6 hours for next 72 hours then as needed. Use ice several times daily also.  A prescription for pain medication may be given to you upon discharge.  Take your pain medication as prescribed, if needed.  If narcotic pain medicine is not needed, then you may take acetaminophen (Tylenol), naprosyn (Alleve) or ibuprofen (Advil) as needed. Take your usually prescribed medications unless otherwise directed If you need a refill on your pain medication, please contact your pharmacy.  They will contact our office to request authorization.  Prescriptions will not be filled after 5pm or on week-ends. You should eat very light the first 24 hours after surgery, such as soup, crackers, pudding, etc.  Resume your normal diet the day after surgery. Most patients will experience some swelling and bruising in the breast.  Ice packs and a good support bra will help.  Wear the breast binder provided or a sports bra for 72 hours day and night.  After that wear a sports bra during the day until you return to the office. Swelling and bruising can take several days to resolve.  It is common to experience some constipation if taking pain medication after surgery.  Increasing fluid intake and taking a stool softener will usually help or prevent this problem from occurring.  A mild laxative (Milk of Magnesia or Miralax) should be taken according to package directions if there are no bowel movements after 48 hours. I used skin glue on the incision, you may shower in 24 hours.  The glue will flake off over the next 2-3 weeks.  Any sutures or staples will be removed at the office during your follow-up visit. ACTIVITIES:  You may resume regular daily activities (gradually increasing) beginning the next day.  Wearing a good support bra or sports bra minimizes pain and swelling.  You may have  sexual intercourse when it is comfortable. You may drive when you no longer are taking prescription pain medication, you can comfortably wear a seatbelt, and you can safely maneuver your car and apply brakes. RETURN TO WORK:  ______________________________________________________________________________________ Dennis Bast should see your doctor in the office for a follow-up appointment approximately two weeks after your surgery.  Your doctor's nurse will typically make your follow-up appointment when she calls you with your pathology report.  Expect your pathology report 3-4 business days after your surgery.  You may call to check if you do not hear from [place] after three days. OTHER INSTRUCTIONS: _______________________________________________________________________________________________ _____________________________________________________________________________________________________________________________________ _____________________________________________________________________________________________________________________________________ _____________________________________________________________________________________________________________________________________  WHEN TO CALL DR WAKEFIELD: Fever over 101.0 Nausea and/or vomiting. Extreme swelling or bruising. Continued bleeding from incision. Increased pain, redness, or drainage from the incision.  The clinic staff is available to answer your questions during regular business hours.  Please don't hesitate to call and ask to speak to one of the nurses for clinical concerns.  If you have a medical emergency, go to the nearest emergency room or call 911.  A surgeon from [facility] is always on call at the hospital.  For further questions, please visit [email or web address] No Tylenol until 3:45    Post Anesthesia Home Care Instructions  Activity: Get plenty of rest for the remainder of the day. A responsible  individual must stay with you for 24 hours following the procedure.  For the next 24 hours, DO NOT: -Drive a car -Paediatric nurse -Drink alcoholic beverages -Take  any medication unless instructed by your physician -Make any legal decisions or sign important papers.  Meals: Start with liquid foods such as gelatin or soup. Progress to regular foods as tolerated. Avoid greasy, spicy, heavy foods. If nausea and/or vomiting occur, drink only clear liquids until the nausea and/or vomiting subsides. Call your physician if vomiting continues.  Special Instructions/Symptoms: Your throat may feel dry or sore from the anesthesia or the breathing tube placed in your throat during surgery. If this causes discomfort, gargle with warm salt water. The discomfort should disappear within 24 hours.  If you had a scopolamine patch placed behind your ear for the management of post- operative nausea and/or vomiting:  1. The medication in the patch is effective for 72 hours, after which it should be removed.  Wrap patch in a tissue and discard in the trash. Wash hands thoroughly with soap and water. 2. You may remove the patch earlier than 72 hours if you experience unpleasant side effects which may include dry mouth, dizziness or visual disturbances. 3. Avoid touching the patch. Wash your hands with soap and water after contact with the patch.

## 2022-04-04 NOTE — Anesthesia Preprocedure Evaluation (Addendum)
Anesthesia Evaluation  Patient identified by MRN, date of birth, ID band Patient awake    Reviewed: Allergy & Precautions, NPO status , Patient's Chart, lab work & pertinent test results  History of Anesthesia Complications (+) PONV and history of anesthetic complications  Airway Mallampati: II  TM Distance: >3 FB Neck ROM: Full    Dental no notable dental hx.    Pulmonary neg pulmonary ROS   Pulmonary exam normal        Cardiovascular hypertension, Pt. on home beta blockers Normal cardiovascular exam     Neuro/Psych negative neurological ROS  negative psych ROS   GI/Hepatic Neg liver ROS,GERD  Medicated and Controlled,,  Endo/Other  negative endocrine ROS    Renal/GU negative Renal ROS     Musculoskeletal  (+) Arthritis ,    Abdominal  (+) + obese  Peds  Hematology negative hematology ROS (+)   Anesthesia Other Findings RIGHT BREAST CALCIFICATIONS  Reproductive/Obstetrics                             Anesthesia Physical Anesthesia Plan  ASA: 2  Anesthesia Plan: General   Post-op Pain Management:    Induction: Intravenous  PONV Risk Score and Plan: 4 or greater and Ondansetron, Dexamethasone, Propofol infusion, Midazolam and Treatment may vary due to age or medical condition  Airway Management Planned: LMA  Additional Equipment:   Intra-op Plan:   Post-operative Plan: Extubation in OR  Informed Consent: I have reviewed the patients History and Physical, chart, labs and discussed the procedure including the risks, benefits and alternatives for the proposed anesthesia with the patient or authorized representative who has indicated his/her understanding and acceptance.     Dental advisory given  Plan Discussed with: CRNA  Anesthesia Plan Comments:        Anesthesia Quick Evaluation

## 2022-04-04 NOTE — Interval H&P Note (Signed)
History and Physical Interval Note:  04/04/2022 10:06 AM  Amy Newman  has presented today for surgery, with the diagnosis of RIGHT BREAST CALCIFICATIONS.  The various methods of treatment have been discussed with the patient and family. After consideration of risks, benefits and other options for treatment, the patient has consented to  Procedure(s) with comments: RIGHT BREAST SEED GUIDED EXCISIONAL BIOPSY (Right) - LMA as a surgical intervention.  The patient's history has been reviewed, patient examined, no change in status, stable for surgery.  I have reviewed the patient's chart and labs.  Questions were answered to the patient's satisfaction.     Rolm Bookbinder

## 2022-04-04 NOTE — Anesthesia Postprocedure Evaluation (Signed)
Anesthesia Post Note  Patient: Amy Newman  Procedure(s) Performed: RIGHT BREAST SEED GUIDED EXCISIONAL BIOPSY (Right: Breast)     Patient location during evaluation: PACU Anesthesia Type: General Level of consciousness: awake Pain management: pain level controlled Vital Signs Assessment: post-procedure vital signs reviewed and stable Respiratory status: spontaneous breathing, nonlabored ventilation and respiratory function stable Cardiovascular status: blood pressure returned to baseline and stable Postop Assessment: no apparent nausea or vomiting Anesthetic complications: no   No notable events documented.  Last Vitals:  Vitals:   04/04/22 1200 04/04/22 1223  BP: (!) 143/63 (!) 165/75  Pulse: 63 68  Resp: 15 16  Temp:  (!) 36.3 C  SpO2: 95% 98%    Last Pain:  Vitals:   04/04/22 1213  TempSrc:   PainSc: 2                  Grover Robinson P Eloy Fehl

## 2022-04-05 ENCOUNTER — Encounter (HOSPITAL_BASED_OUTPATIENT_CLINIC_OR_DEPARTMENT_OTHER): Payer: Self-pay | Admitting: General Surgery

## 2022-04-05 LAB — SURGICAL PATHOLOGY

## 2022-04-13 ENCOUNTER — Encounter: Payer: Self-pay | Admitting: *Deleted

## 2022-04-17 ENCOUNTER — Encounter: Payer: Self-pay | Admitting: Internal Medicine

## 2022-04-17 ENCOUNTER — Ambulatory Visit: Payer: Medicare HMO | Attending: Internal Medicine | Admitting: Internal Medicine

## 2022-04-17 VITALS — BP 146/82 | HR 69 | Ht 68.0 in | Wt 235.2 lb

## 2022-04-17 DIAGNOSIS — I4510 Unspecified right bundle-branch block: Secondary | ICD-10-CM | POA: Insufficient documentation

## 2022-04-17 DIAGNOSIS — Z7902 Long term (current) use of antithrombotics/antiplatelets: Secondary | ICD-10-CM | POA: Diagnosis not present

## 2022-04-17 DIAGNOSIS — I10 Essential (primary) hypertension: Secondary | ICD-10-CM | POA: Insufficient documentation

## 2022-04-17 DIAGNOSIS — I440 Atrioventricular block, first degree: Secondary | ICD-10-CM | POA: Diagnosis not present

## 2022-04-17 MED ORDER — AMLODIPINE BESYLATE 5 MG PO TABS: 5.0000 mg | ORAL_TABLET | Freq: Every day | ORAL | 2 refills | Status: DC

## 2022-04-17 NOTE — Progress Notes (Signed)
Cardiology Office Note  Date: 04/17/2022   ID: Malini, Lehrmann 02-23-1950, MRN [id number]  PCP:  Avis Epley, PA-C  Cardiologist:  Marjo Bicker, MD Electrophysiologist:  None   Reason for Office Visit: Evaluation of first-degree AV block at the request of Jean Rosenthal, PA-C   History of Present Illness: NEHEMIAH ZADRA is a 72 y.o. female known to have HTN, was referred to cardiology clinic for evaluation of first-degree AV block and incomplete RBBB on the EKG. I do not have the EKG from the PCP to review which has incomplete RBBB.  But EKG today showed NSR, first-degree AV block.  Patient has 1 son and no grandkids.  She does not have a partner and lives by herself.  She goes to gym 3 times per week and has no symptoms of angina or DOE.  Has some dizziness occasionally but thinks this could be from the vertigo as the meclizine medication helps her.  No syncope or palpitations or leg swelling. Denies smoking cigarettes. She is currently on Aspir 81 milligram once daily due to family history of strokes in multiple family members.  Past Medical History:  Diagnosis Date   Arthritis    Cancer    cervical cancer   Glaucoma    H/O: cesarean section    Hypertension    Lumbar spinal stenosis 09/29/2020   Obese    PONV (postoperative nausea and vomiting)     Past Surgical History:  Procedure Laterality Date   athroscopic knee surgery bilaterall Bilateral    BREAST BIOPSY  03/31/2022   MM RT RADIOACTIVE SEED LOC MAMMO GUIDE 03/31/2022 [facility]   CARPAL TUNNEL RELEASE Right    CESAREAN SECTION     RADIOACTIVE SEED GUIDED EXCISIONAL BREAST BIOPSY Right 04/04/2022   Procedure: RIGHT BREAST SEED GUIDED EXCISIONAL BIOPSY;  Surgeon: Emelia Loron, MD;  Location: [facility];  Service: General;  Laterality: Right;  LMA   ROTATOR CUFF REPAIR Right    TOTAL KNEE ARTHROPLASTY Left 08/23/2020   Procedure: TOTAL KNEE ARTHROPLASTY;  Surgeon:  Ollen Gross, MD;  Location: [facility];  Service: Orthopedics;  Laterality: Left;   TOTAL KNEE ARTHROPLASTY Right 12/20/2020   Procedure: TOTAL KNEE ARTHROPLASTY;  Surgeon: Ollen Gross, MD;  Location: [facility];  Service: Orthopedics;  Laterality: Right;    Current Outpatient Medications  Medication Sig Dispense Refill   amLODipine (NORVASC) 5 MG tablet Take 1 tablet (5 mg total) by mouth daily. Send refill request to family doctor 30 tablet 2   atorvastatin (LIPITOR) 20 MG tablet Take 20 mg by mouth at bedtime.     brimonidine (ALPHAGAN) 0.2 % ophthalmic solution Place 1 drop into both eyes 2 (two) times daily.     cetirizine (ZYRTEC) 10 MG tablet Take 10 mg by mouth daily as needed for allergies.     ibuprofen (ADVIL) 800 MG tablet Take 800 mg by mouth as needed.     latanoprost (XALATAN) 0.005 % ophthalmic solution Place 1 drop into both eyes at bedtime.     meclizine (ANTIVERT) 25 MG tablet Take 25 mg by mouth every 6 (six) hours as needed for dizziness.     omeprazole (PRILOSEC) 20 MG capsule Take 20 mg by mouth daily before breakfast.     No current facility-administered medications for this visit.   Allergies:  Hydrocodone, Tramadol, and Penicillins   Social History: The patient  reports that she has never smoked. She has never been exposed to tobacco smoke. She  has never used smokeless tobacco. She reports that she does not drink alcohol and does not use drugs.   Family History: The patient's family history includes Cancer in her father and sister; Diabetes in her brother and mother; Peripheral Artery Disease in her brother; Stroke in her mother.   ROS:  Please see the history of present illness. Otherwise, complete review of systems is positive for none.  All other systems are reviewed and negative.   Physical Exam: VS:  BP (!) 146/82   Pulse 69   Ht  (1.727 m)   Wt 235 lb 3.2 oz (106.7 kg)   SpO2 97%   BMI 35.76 kg/m , BMI Body mass index is 35.76 kg/m.  Wt Readings  from Last 3 Encounters:  04/17/22 235 lb 3.2 oz (106.7 kg)  04/04/22 231 lb 0.7 oz (104.8 kg)  12/20/20 213 lb (96.6 kg)    General: Patient appears comfortable at rest. HEENT: Conjunctiva and lids normal, oropharynx clear with moist mucosa. Neck: Supple, no elevated JVP or carotid bruits, no thyromegaly. Lungs: Clear to auscultation, nonlabored breathing at rest. Cardiac: Regular rate and rhythm, no S3 or significant systolic murmur, no pericardial rub. Abdomen: Soft, nontender, no hepatomegaly, bowel sounds present, no guarding or rebound. Extremities: No pitting edema, distal pulses 2+. Skin: Warm and dry. Musculoskeletal: No kyphosis. Neuropsychiatric: Alert and oriented x3, affect grossly appropriate.  ECG: NSR, first-degree AV block, PR interval 216 ms  Recent Labwork: No results found for requested labs within last 365 days.  No results found for: "CHOL", "TRIG", "HDL", "CHOLHDL", "VLDL", "LDLCALC", "LDLDIRECT"  Other Studies Reviewed Today: I personally reviewed the heart care referral records from the PCP  Assessment and Plan: Patient is a 72 year old F known to have HTN was referred to cardiology clinic for evaluation of first-degree AV block.  # First-degree AV block: EKG today showed NSR, plus degree AV block with PR interval 216 ms.  No symptoms. This is benign and likely secondary to atenolol use. Will stop atenolol and start amlodipine 5 mg once daily for HTN. # HTN, controlled: Switch atenolol to amlodipine 5 mg once daily. # Long-term use of aspirin: No indication of aspirin 81 mg after 70 years of age if no personal history of MI or PAD or CVA.  I have spent a total of 45 minutes with patient reviewing chart , EKGs, labs and examining patient as well as establishing an assessment and plan that was discussed with the patient.  > 50% of time was spent in direct patient care.     Medication Adjustments/Labs and Tests Ordered: Current medicines are reviewed at length  with the patient today.  Concerns regarding medicines are outlined above.   Tests Ordered: Orders Placed This Encounter  Procedures   EKG 12-Lead    Medication Changes: Meds ordered this encounter  Medications   amLODipine (NORVASC) 5 MG tablet    Sig: Take 1 tablet (5 mg total) by mouth daily. Send refill request to family doctor    Dispense:  30 tablet    Refill:  2    04/17/2022 NEW-stop atenolol    Disposition:  Follow up prn  Signed, Analaya Hoey Verne Spurr, MD, 04/17/2022 11:39 AM    [facility] [address] [place], [place] [address]

## 2022-04-17 NOTE — Patient Instructions (Addendum)
Medication Instructions:  Your physician has recommended you make the following change in your medication:  Stop aspirin Stop atenolol Start amlodipine 5 mg daily Continue other medications the same  Labwork: none  Testing/Procedures: none  Follow-Up: Your physician recommends that you schedule a follow-up appointment in: as needed  Any Other Special Instructions Will Be Listed Below (If Applicable).  If you need a refill on your cardiac medications before your next appointment, please call your pharmacy.

## 2022-04-26 ENCOUNTER — Encounter: Payer: Medicare HMO | Admitting: Genetic Counselor

## 2022-04-26 ENCOUNTER — Other Ambulatory Visit: Payer: Medicare HMO

## 2022-05-18 DIAGNOSIS — K219 Gastro-esophageal reflux disease without esophagitis: Secondary | ICD-10-CM | POA: Diagnosis not present

## 2022-05-18 DIAGNOSIS — E782 Mixed hyperlipidemia: Secondary | ICD-10-CM | POA: Diagnosis not present

## 2022-05-18 DIAGNOSIS — I10 Essential (primary) hypertension: Secondary | ICD-10-CM | POA: Diagnosis not present

## 2022-05-18 DIAGNOSIS — Z0001 Encounter for general adult medical examination with abnormal findings: Secondary | ICD-10-CM | POA: Diagnosis not present

## 2022-05-18 DIAGNOSIS — E6609 Other obesity due to excess calories: Secondary | ICD-10-CM | POA: Diagnosis not present

## 2022-05-18 DIAGNOSIS — Z1331 Encounter for screening for depression: Secondary | ICD-10-CM | POA: Diagnosis not present

## 2022-05-18 DIAGNOSIS — Z6835 Body mass index (BMI) 35.0-35.9, adult: Secondary | ICD-10-CM | POA: Diagnosis not present

## 2022-06-02 DIAGNOSIS — J069 Acute upper respiratory infection, unspecified: Secondary | ICD-10-CM | POA: Diagnosis not present

## 2022-06-02 DIAGNOSIS — E6609 Other obesity due to excess calories: Secondary | ICD-10-CM | POA: Diagnosis not present

## 2022-06-02 DIAGNOSIS — Z6835 Body mass index (BMI) 35.0-35.9, adult: Secondary | ICD-10-CM | POA: Diagnosis not present

## 2022-07-25 DIAGNOSIS — H401122 Primary open-angle glaucoma, left eye, moderate stage: Secondary | ICD-10-CM | POA: Diagnosis not present

## 2022-11-08 DIAGNOSIS — H401111 Primary open-angle glaucoma, right eye, mild stage: Secondary | ICD-10-CM | POA: Diagnosis not present

## 2022-12-07 DIAGNOSIS — J069 Acute upper respiratory infection, unspecified: Secondary | ICD-10-CM | POA: Diagnosis not present

## 2023-01-12 DIAGNOSIS — Z20828 Contact with and (suspected) exposure to other viral communicable diseases: Secondary | ICD-10-CM | POA: Diagnosis not present

## 2023-01-12 DIAGNOSIS — U071 COVID-19: Secondary | ICD-10-CM | POA: Diagnosis not present

## 2023-01-12 DIAGNOSIS — M170 Bilateral primary osteoarthritis of knee: Secondary | ICD-10-CM | POA: Diagnosis not present

## 2023-01-12 DIAGNOSIS — E6609 Other obesity due to excess calories: Secondary | ICD-10-CM | POA: Diagnosis not present

## 2023-01-12 DIAGNOSIS — Z6834 Body mass index (BMI) 34.0-34.9, adult: Secondary | ICD-10-CM | POA: Diagnosis not present

## 2023-02-07 DIAGNOSIS — Z6834 Body mass index (BMI) 34.0-34.9, adult: Secondary | ICD-10-CM | POA: Diagnosis not present

## 2023-02-07 DIAGNOSIS — L209 Atopic dermatitis, unspecified: Secondary | ICD-10-CM | POA: Diagnosis not present

## 2023-02-07 DIAGNOSIS — I10 Essential (primary) hypertension: Secondary | ICD-10-CM | POA: Diagnosis not present

## 2023-02-07 DIAGNOSIS — E6609 Other obesity due to excess calories: Secondary | ICD-10-CM | POA: Diagnosis not present

## 2023-02-07 DIAGNOSIS — Z9229 Personal history of other drug therapy: Secondary | ICD-10-CM | POA: Diagnosis not present

## 2023-02-27 DIAGNOSIS — H401122 Primary open-angle glaucoma, left eye, moderate stage: Secondary | ICD-10-CM | POA: Diagnosis not present

## 2023-02-27 DIAGNOSIS — H524 Presbyopia: Secondary | ICD-10-CM | POA: Diagnosis not present

## 2023-03-22 DIAGNOSIS — I10 Essential (primary) hypertension: Secondary | ICD-10-CM | POA: Diagnosis not present

## 2023-03-22 DIAGNOSIS — R21 Rash and other nonspecific skin eruption: Secondary | ICD-10-CM | POA: Diagnosis not present

## 2023-03-22 DIAGNOSIS — Z6833 Body mass index (BMI) 33.0-33.9, adult: Secondary | ICD-10-CM | POA: Diagnosis not present

## 2023-03-22 DIAGNOSIS — E6609 Other obesity due to excess calories: Secondary | ICD-10-CM | POA: Diagnosis not present

## 2023-04-09 DIAGNOSIS — H401122 Primary open-angle glaucoma, left eye, moderate stage: Secondary | ICD-10-CM | POA: Diagnosis not present

## 2023-04-16 DIAGNOSIS — L02423 Furuncle of right upper limb: Secondary | ICD-10-CM | POA: Diagnosis not present

## 2023-04-16 DIAGNOSIS — B9689 Other specified bacterial agents as the cause of diseases classified elsewhere: Secondary | ICD-10-CM | POA: Diagnosis not present

## 2023-04-16 DIAGNOSIS — S1086XA Insect bite of other specified part of neck, initial encounter: Secondary | ICD-10-CM | POA: Diagnosis not present

## 2023-06-19 ENCOUNTER — Other Ambulatory Visit (HOSPITAL_COMMUNITY): Payer: Self-pay | Admitting: Family Medicine

## 2023-06-19 ENCOUNTER — Ambulatory Visit (HOSPITAL_COMMUNITY)
Admission: RE | Admit: 2023-06-19 | Discharge: 2023-06-19 | Disposition: A | Discharge status: Home / Self Care | Source: Ambulatory Visit | Attending: Family Medicine | Admitting: Family Medicine

## 2023-06-19 ENCOUNTER — Encounter (HOSPITAL_COMMUNITY): Payer: Self-pay | Admitting: Family Medicine

## 2023-06-19 DIAGNOSIS — M533 Sacrococcygeal disorders, not elsewhere classified: Secondary | ICD-10-CM

## 2023-06-19 DIAGNOSIS — M47816 Spondylosis without myelopathy or radiculopathy, lumbar region: Secondary | ICD-10-CM | POA: Diagnosis not present

## 2023-06-19 DIAGNOSIS — E6609 Other obesity due to excess calories: Secondary | ICD-10-CM | POA: Diagnosis not present

## 2023-06-19 DIAGNOSIS — Z6833 Body mass index (BMI) 33.0-33.9, adult: Secondary | ICD-10-CM | POA: Diagnosis not present

## 2023-06-19 DIAGNOSIS — M5127 Other intervertebral disc displacement, lumbosacral region: Secondary | ICD-10-CM | POA: Diagnosis not present

## 2023-06-19 DIAGNOSIS — M4316 Spondylolisthesis, lumbar region: Secondary | ICD-10-CM | POA: Diagnosis not present

## 2023-06-28 ENCOUNTER — Other Ambulatory Visit (HOSPITAL_COMMUNITY): Payer: Self-pay | Admitting: Family Medicine

## 2023-06-28 ENCOUNTER — Ambulatory Visit (HOSPITAL_COMMUNITY)
Admission: RE | Admit: 2023-06-28 | Discharge: 2023-06-28 | Disposition: A | Discharge status: Home / Self Care | Source: Ambulatory Visit | Attending: Family Medicine | Admitting: Family Medicine

## 2023-06-28 DIAGNOSIS — M25551 Pain in right hip: Secondary | ICD-10-CM | POA: Diagnosis not present

## 2023-06-28 DIAGNOSIS — E6609 Other obesity due to excess calories: Secondary | ICD-10-CM | POA: Diagnosis not present

## 2023-06-28 DIAGNOSIS — Z6833 Body mass index (BMI) 33.0-33.9, adult: Secondary | ICD-10-CM | POA: Diagnosis not present

## 2023-06-28 DIAGNOSIS — M160 Bilateral primary osteoarthritis of hip: Secondary | ICD-10-CM | POA: Diagnosis not present

## 2023-09-05 ENCOUNTER — Telehealth: Payer: Self-pay

## 2023-09-05 ENCOUNTER — Ambulatory Visit (INDEPENDENT_AMBULATORY_CARE_PROVIDER_SITE_OTHER): Payer: Self-pay

## 2023-09-05 VITALS — BP 134/70 | HR 81 | Wt 223.0 lb

## 2023-09-05 DIAGNOSIS — M2550 Pain in unspecified joint: Secondary | ICD-10-CM | POA: Insufficient documentation

## 2023-09-05 DIAGNOSIS — M1711 Unilateral primary osteoarthritis, right knee: Secondary | ICD-10-CM

## 2023-09-05 DIAGNOSIS — M48061 Spinal stenosis, lumbar region without neurogenic claudication: Secondary | ICD-10-CM

## 2023-09-05 DIAGNOSIS — E782 Mixed hyperlipidemia: Secondary | ICD-10-CM

## 2023-09-05 DIAGNOSIS — I10 Essential (primary) hypertension: Secondary | ICD-10-CM | POA: Diagnosis not present

## 2023-09-05 DIAGNOSIS — E785 Hyperlipidemia, unspecified: Secondary | ICD-10-CM | POA: Insufficient documentation

## 2023-09-05 DIAGNOSIS — M1712 Unilateral primary osteoarthritis, left knee: Secondary | ICD-10-CM | POA: Diagnosis not present

## 2023-09-05 NOTE — Assessment & Plan Note (Signed)
 Blood pressure well controlled with Norvasc .  Continue with current dose.

## 2023-09-05 NOTE — Assessment & Plan Note (Signed)
 History of bilateral knee replacements No issues or pain with knee replacements, surgeries successful.

## 2023-09-05 NOTE — Progress Notes (Signed)
 New Patient Office Visit  Subjective    Patient ID: Amy Newman, female    DOB: 1950-03-15  Age: 73 y.o. MRN: [id number]  CC:  Chief Complaint  Patient presents with   Establish Care    HPI Amy Newman presents to establish care HPI Discussed the use of AI scribe software for clinical note transcription with the patient, who gave verbal consent to proceed.  History of Present Illness   Amy Newman is a 73 year old female who presents for establishment of care.  Hypertension - Managed with Norvasc  - Typical blood pressure readings around 129/72 mmHg - Believes higher readings are due to measurement devices  Lower extremity arthroplasty - Bilateral knee replacements performed by Dempsey Aluccio - No current issues with swelling or pain  Hip pain and trauma - Sustained a fall in June landing on tailbone - Significant discomfort in hip following fall - Hip x-rays performed twice, no fractures identified, diagnosed as bruised - History of arthritis in both hips, with arthritis present in affected area  Cervical cancer - History of cervical cancer - No history of hysterectomy  Spinal nerve compression - History of eight pinched nerves in back - Not pursuing surgical intervention  Vertigo - Severe vertigo  Gastrointestinal symptoms and colorectal cancer screening - Sensitive stomach - Selective eater, avoids greens, vegetables, pasta, ketchup, and mustard - Prefers meats and sweets - Consumes fruits such as cantaloupe, honeydew melon, and grapes - Drinks three bottles of water  daily - Occasionally consumes alcohol on Sundays - No colonoscopy in years due to prolonged vomiting with anesthesia - Previously used Cologuard for screening, last test several years ago       Outpatient Encounter Medications as of 09/05/2023  Medication Sig   amLODipine  (NORVASC ) 5 MG tablet Take 1 tablet (5 mg total) by mouth daily. Send refill request to family  doctor   atorvastatin  (LIPITOR) 20 MG tablet Take 20 mg by mouth at bedtime.   brimonidine  (ALPHAGAN ) 0.2 % ophthalmic solution Place 1 drop into both eyes 2 (two) times daily.   cetirizine (ZYRTEC) 10 MG tablet Take 10 mg by mouth daily as needed for allergies.   ibuprofen (ADVIL) 800 MG tablet Take 800 mg by mouth as needed.   latanoprost  (XALATAN ) 0.005 % ophthalmic solution Place 1 drop into both eyes at bedtime.   meclizine (ANTIVERT) 25 MG tablet Take 25 mg by mouth every 6 (six) hours as needed for dizziness.   omeprazole (PRILOSEC) 20 MG capsule Take 20 mg by mouth daily before breakfast.   No facility-administered encounter medications on file as of 09/05/2023.    Past Medical History:  Diagnosis Date   Arthritis    Cancer (HCC)    cervical cancer   Glaucoma    H/O: cesarean section    Hypertension    Lumbar spinal stenosis 09/29/2020   Obese    PONV (postoperative nausea and vomiting)     Past Surgical History:  Procedure Laterality Date   athroscopic knee surgery bilaterall Bilateral    BREAST BIOPSY  03/31/2022   MM RT RADIOACTIVE SEED LOC MAMMO GUIDE 03/31/2022 [facility]   CARPAL TUNNEL RELEASE Right    CESAREAN SECTION     RADIOACTIVE SEED GUIDED EXCISIONAL BREAST BIOPSY Right 04/04/2022   Procedure: RIGHT BREAST SEED GUIDED EXCISIONAL BIOPSY;  Surgeon: Ebbie Cough, MD;  Location: [facility];  Service: General;  Laterality: Right;  LMA   ROTATOR CUFF REPAIR Right  TOTAL KNEE ARTHROPLASTY Left 08/23/2020   Procedure: TOTAL KNEE ARTHROPLASTY;  Surgeon: Melodi Lerner, MD;  Location: [facility];  Service: Orthopedics;  Laterality: Left;   TOTAL KNEE ARTHROPLASTY Right 12/20/2020   Procedure: TOTAL KNEE ARTHROPLASTY;  Surgeon: Melodi Lerner, MD;  Location: [facility];  Service: Orthopedics;  Laterality: Right;    Family History  Problem Relation Age of Onset   Stroke Mother    Diabetes Mother    Cancer Father    Cancer Sister        breast    Diabetes Brother    Peripheral Artery Disease Brother     Social History   Socioeconomic History   Marital status: Divorced    Spouse name: Not on file   Number of children: Not on file   Years of education: Not on file   Highest education level: Not on file  Occupational History   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[employer]  Tobacco Use   Smoking status: Never    Passive exposure: Never   Smokeless tobacco: Never  Vaping Use   Vaping status: Never Used  Substance and Sexual Activity   Alcohol use: Never   Drug use: Never   Sexual activity: Not Currently    Birth control/protection: Post-menopausal  Other Topics Concern   Not on file  Social History Narrative   Lives alone   Right Handed   Drinks 1 can soda on Sunday   Social Drivers of Health   Financial Resource Strain: Low Risk  (09/05/2023)   Overall Financial Resource Strain (CARDIA)    Difficulty of Paying Living Expenses: Not hard at all  Food Insecurity: No Food Insecurity (09/05/2023)   Hunger Vital Sign    Worried About Running Out of Food in the Last Year: Never true    Ran Out of Food in the Last Year: Never true  Transportation Needs: No Transportation Needs (09/05/2023)   PRAPARE - Administrator, Civil Service (Medical): No    Lack of Transportation (Non-Medical): No  Physical Activity: Sufficiently Active (09/05/2023)   Exercise Vital Sign    Days of Exercise per Week: 4 days    Minutes of Exercise per Session: 60 min  Stress: Not on file  Social Connections: Moderately Integrated (09/05/2023)   Social Connection and Isolation Panel    Frequency of Communication with Friends and Family: More than three times a week    Frequency of Social Gatherings with Friends and Family: More than three times a week    Attends Religious Services: More than 4 times per year    Active Member of [employer] or Organizations: Yes    Attends Banker Meetings: 1 to 4 times per year    Marital  Status: Divorced  Catering manager Violence: Not At Risk (09/05/2023)   Humiliation, Afraid, Rape, and Kick questionnaire    Fear of Current or Ex-Partner: No    Emotionally Abused: No    Physically Abused: No    Sexually Abused: No    ROS      Objective    BP 134/70 (BP Location: Left Arm, Patient Position: Sitting, Cuff Size: Normal)   Pulse 81   Wt 223 lb (101.2 kg)   SpO2 94%   BMI 33.91 kg/m   Physical Exam Vitals and nursing note reviewed.  Constitutional:      Appearance: Normal appearance.  HENT:     Head: Normocephalic.  Eyes:     Extraocular Movements: Extraocular  movements intact.     Pupils: Pupils are equal, round, and reactive to light.  Cardiovascular:     Rate and Rhythm: Normal rate and regular rhythm.  Pulmonary:     Effort: Pulmonary effort is normal.     Breath sounds: Normal breath sounds.  Musculoskeletal:     Cervical back: Normal range of motion and neck supple.  Neurological:     Mental Status: She is alert and oriented to person, place, and time.  Psychiatric:        Mood and Affect: Mood normal.        Thought Content: Thought content normal.         Assessment & Plan:   Problem List Items Addressed This Visit       Cardiovascular and Mediastinum   HTN (hypertension) - Primary (Chronic)   Blood pressure well controlled with Norvasc .  Continue with current dose.          Musculoskeletal and Integument   Primary osteoarthritis of left knee   History of bilateral knee replacements No issues or pain with knee replacements, surgeries successful.      Primary osteoarthritis of right knee   History of bilateral knee replacements No issues or pain with knee replacements, surgeries successful.          Other   Hyperlipidemia (Chronic)   Managed with atorvastatin .  No refills needed at this time.        Lumbar spinal stenosis   Lumbar radiculopathy Eight pinched nerves, surgery advised but not currently considered.                  Return in about 6 months (around 03/04/2024) for chronic follow-up with PCP.   Leita Longs, FNP

## 2023-09-05 NOTE — Assessment & Plan Note (Signed)
 Lumbar radiculopathy Eight pinched nerves, surgery advised but not currently considered.

## 2023-09-05 NOTE — Assessment & Plan Note (Signed)
 Managed with atorvastatin .  No refills needed at this time.

## 2023-09-05 NOTE — Telephone Encounter (Signed)
 Placard Noted Copied Sleeved Original put in provider box Copy placed front desk folder  Call patient when ready.

## 2023-09-10 NOTE — Telephone Encounter (Signed)
 Pt called and was advised it was ready for pick up, will place in front office to be picked up

## 2023-10-15 DIAGNOSIS — H401131 Primary open-angle glaucoma, bilateral, mild stage: Secondary | ICD-10-CM | POA: Diagnosis not present

## 2023-10-22 ENCOUNTER — Other Ambulatory Visit (HOSPITAL_COMMUNITY): Payer: Self-pay

## 2023-10-22 DIAGNOSIS — Z1231 Encounter for screening mammogram for malignant neoplasm of breast: Secondary | ICD-10-CM

## 2023-10-25 ENCOUNTER — Encounter (HOSPITAL_COMMUNITY): Payer: Self-pay

## 2023-10-25 ENCOUNTER — Ambulatory Visit (HOSPITAL_COMMUNITY)
Admission: RE | Admit: 2023-10-25 | Discharge: 2023-10-25 | Disposition: A | Discharge status: Home / Self Care | Source: Ambulatory Visit

## 2023-10-25 DIAGNOSIS — Z1231 Encounter for screening mammogram for malignant neoplasm of breast: Secondary | ICD-10-CM | POA: Insufficient documentation

## 2023-10-26 ENCOUNTER — Encounter: Payer: Self-pay | Admitting: Family Medicine

## 2023-10-26 ENCOUNTER — Ambulatory Visit (INDEPENDENT_AMBULATORY_CARE_PROVIDER_SITE_OTHER): Admitting: Family Medicine

## 2023-10-26 VITALS — BP 142/72 | HR 83 | Ht 68.0 in | Wt 223.0 lb

## 2023-10-26 DIAGNOSIS — L089 Local infection of the skin and subcutaneous tissue, unspecified: Secondary | ICD-10-CM | POA: Diagnosis not present

## 2023-10-26 DIAGNOSIS — L729 Follicular cyst of the skin and subcutaneous tissue, unspecified: Secondary | ICD-10-CM | POA: Diagnosis not present

## 2023-10-26 DIAGNOSIS — E669 Obesity, unspecified: Secondary | ICD-10-CM | POA: Insufficient documentation

## 2023-10-26 DIAGNOSIS — E66811 Obesity, class 1: Secondary | ICD-10-CM | POA: Insufficient documentation

## 2023-10-26 MED ORDER — DOXYCYCLINE HYCLATE 100 MG PO CAPS: 100.0000 mg | ORAL_CAPSULE | Freq: Two times a day (BID) | ORAL | 0 refills | Status: DC

## 2023-10-28 DIAGNOSIS — L089 Local infection of the skin and subcutaneous tissue, unspecified: Secondary | ICD-10-CM | POA: Diagnosis not present

## 2023-10-28 DIAGNOSIS — L729 Follicular cyst of the skin and subcutaneous tissue, unspecified: Secondary | ICD-10-CM | POA: Diagnosis not present

## 2023-10-28 NOTE — Assessment & Plan Note (Signed)
 Thought to be an abscess but once I & D was performed, material consistent with infected cyst. Doxy as directed.

## 2023-10-28 NOTE — Progress Notes (Signed)
 Subjective:  Patient ID: Amy Newman, female    DOB: September 04, 1950  Age: 73 y.o. MRN: [id number]  CC:   Chief Complaint  Patient presents with   Recurrent Skin Infections    HPI:  73 year old female presents with concerns for a boil to her left upper thigh.  Has recently developed an area or warm and swelling with associated pain to the left upper thigh/groin. She believes she has a boil. Has had these before. No fever. No drainage. She has been applying warm compresses without relief.  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   Arthralgia of both knees 06/03/2020    Social Hx   Social History   Socioeconomic History   Marital status: Divorced    Spouse name: Not on file   Number of children: Not on file   Years of education: Not on file   Highest education level: Not on file  Occupational History   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[employer]  Tobacco Use   Smoking status: Never    Passive exposure: Never   Smokeless tobacco: Never  Vaping Use   Vaping status: Never Used  Substance and Sexual Activity   Alcohol use: Never   Drug use: Never   Sexual activity: Not Currently    Birth control/protection: Post-menopausal  Other Topics Concern   Not on file  Social History Narrative   Lives alone   Right Handed   Drinks 1 can soda on Sunday   Social Drivers of Health    Financial Resource Strain: Low Risk  (09/05/2023)   Overall Financial Resource Strain (CARDIA)    Difficulty of Paying Living Expenses: Not hard at all  Food Insecurity: No Food Insecurity (09/05/2023)   Hunger Vital Sign    Worried About Running Out of Food in the Last Year: Never true    Ran Out of Food in the Last Year: Never true  Transportation Needs: No Transportation Needs (09/05/2023)   PRAPARE - Administrator, Civil Service (Medical): No    Lack of Transportation (Non-Medical): No  Physical Activity: Sufficiently Active (09/05/2023)   Exercise Vital Sign    Days of Exercise per Week: 4 days    Minutes of Exercise per Session: 60 min  Stress: Not on file  Social Connections: Moderately Integrated (09/05/2023)   Social Connection and Isolation Panel    Frequency of Communication with Friends and Family: More than three times a week    Frequency of Social Gatherings with Friends and Family: More than three times a week    Attends Religious Services: More than 4 times per year    Active Member of [employer] or Organizations: Yes    Attends Banker Meetings: 1 to 4 times per year    Marital Status: Divorced    Review of Systems Per HPI  Objective:  BP (!) 142/72   Pulse  83   Ht 5' 8 (1.727 m)   Wt 223 lb (101.2 kg)   BMI 33.91 kg/m      10/26/2023    9:59 AM 09/05/2023    8:56 AM 09/05/2023    8:18 AM  BP/Weight  Systolic BP 142 134 152  Diastolic BP 72 70 69  Wt. (Lbs) 223  223  BMI 33.91 kg/m2  33.91 kg/m2    Physical Exam Vitals and nursing note reviewed. Exam conducted with a chaperone present.  Constitutional:      General: She is not in acute distress.    Appearance: Normal appearance.  HENT:     Head: Normocephalic and atraumatic.  Skin:        Comments: Small tender erythematous area noted at the labeled location.  Neurological:     Mental Status: She is alert.     Lab Results  Component Value Date   WBC 9.5 12/21/2020   HGB  10.8 (L) 12/21/2020   HCT 33.3 (L) 12/21/2020   PLT 230 12/21/2020   GLUCOSE 138 (H) 12/21/2020   ALT 12 12/09/2020   AST 16 12/09/2020   NA 140 12/21/2020   K 3.7 12/21/2020   CL 108 12/21/2020   CREATININE 1.16 (H) 12/21/2020   BUN 20 12/21/2020   CO2 24 12/21/2020   INR 1.0 12/09/2020   I & D  Date/Time: 10/28/2023 10:08 PM  Performed by: Satrina Magallanes G, DO Authorized by: Armine Rizzolo G, DO   Consent:    Consent obtained:  Verbal   Consent given by:  Patient   Alternatives discussed:  Observation Location:    Type:  Abscess   Location:  Lower extremity   Lower extremity location:  Leg   Leg location:  R upper leg Anesthesia:    Anesthesia method:  Local infiltration   Local anesthetic:  Lidocaine  1% WITH epi Procedure type:    Complexity:  Simple Procedure details:    Incision types:  Stab incision   Scalpel blade:  11   Drainage characteristics: Cyst contents with some purulent drainage.   Drainage amount:  Moderate   Wound treatment:  Wound left open   Packing materials:  None Post-procedure details:    Procedure completion:  Tolerated well, no immediate complications    Assessment & Plan:  Infected cyst of skin Assessment & Plan: Thought to be an abscess but once I & D was performed, material consistent with infected cyst. Doxy as directed.  Orders: -     I & D  Other orders -     Doxycycline Hyclate; Take 1 capsule (100 mg total) by mouth 2 (two) times daily. Take with food.  Dispense: 14 capsule; Refill: 0    Follow-up:  Return if symptoms worsen or fail to improve.  Jacqulyn Ahle DO [facility]

## 2023-11-14 ENCOUNTER — Ambulatory Visit: Payer: Self-pay

## 2023-12-11 ENCOUNTER — Ambulatory Visit

## 2023-12-25 ENCOUNTER — Ambulatory Visit

## 2023-12-31 ENCOUNTER — Other Ambulatory Visit: Payer: Self-pay

## 2024-01-01 ENCOUNTER — Ambulatory Visit: Payer: Self-pay

## 2024-01-01 VITALS — BP 147/84 | HR 79 | Ht 68.0 in | Wt 225.0 lb

## 2024-01-01 DIAGNOSIS — R6889 Other general symptoms and signs: Secondary | ICD-10-CM

## 2024-01-01 DIAGNOSIS — K219 Gastro-esophageal reflux disease without esophagitis: Secondary | ICD-10-CM | POA: Diagnosis not present

## 2024-01-01 DIAGNOSIS — J069 Acute upper respiratory infection, unspecified: Secondary | ICD-10-CM

## 2024-01-01 MED ORDER — OMEPRAZOLE 40 MG PO CPDR: 40.0000 mg | DELAYED_RELEASE_CAPSULE | Freq: Every day | ORAL | 1 refills | Status: AC

## 2024-01-01 MED ORDER — AZITHROMYCIN 250 MG PO TABS: ORAL_TABLET | ORAL | 0 refills | Status: AC

## 2024-01-01 NOTE — Progress Notes (Unsigned)
 "  Established Patient Office Visit  Subjective   Patient ID: Amy Newman, female    DOB: [birth year]  Age: 73 y.o. MRN: [id number]  Chief Complaint  Patient presents with   Medical Management of Chronic Issues    Pt here with flu like symptoms, she thinks it may be the Flu    HPI Discussed the use of AI scribe software for clinical note transcription with the patient, who gave verbal consent to proceed.  History of Present Illness    Amy Newman is a 73 year old female who presents with upper respiratory symptoms including congestion and cough.  Upper respiratory symptoms - Congestion and cough present since December 27, 2023, following exposure at her mother's house in [place] - Fever, chills, body aches, and persistent rhinorrhea - Symptoms have slightly improved with methotrexate - Taking over-the-counter medications, including Mucinex, with some relief - Hydrating with three bottles of water  daily - Two home COVID and flu tests negative  Gastrointestinal symptoms - Diarrhea occurs sporadically - Considering flaxseed supplementation for diarrhea  Gastroesophageal reflux - Currently taking omeprazole for reflux - Continues to experience reflux symptoms after eating certain foods - Previously advised to take omeprazole every other day - Omeprazole not effective in controlling symptoms  Fall risk and mobility concerns - History of falls - Expresses concern about navigating stairs due to fear of falling - Currently living alone     Patient Active Problem List   Diagnosis Date Noted   Gastroesophageal reflux disease without esophagitis 01/03/2024   Infected cyst of skin 10/28/2023   Adult-onset obesity 10/26/2023   Class 1 obesity 10/26/2023   Polyarthralgia 09/05/2023   Hyperlipidemia 09/05/2023   HTN (hypertension) 04/17/2022   Encounter for current long term use of antiplatelet drug 04/17/2022   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  Constitutional:      Appearance: Normal appearance.  HENT:     Head: Normocephalic.     Right Ear: Tympanic membrane, ear canal and external ear normal.     Left Ear: Tympanic membrane, ear canal and external ear normal.     Nose: Rhinorrhea present. No congestion. Rhinorrhea is clear.     Right Sinus: No maxillary sinus tenderness.     Left Sinus: No maxillary sinus tenderness.     Mouth/Throat:     Mouth: Mucous membranes are moist.     Pharynx: Oropharynx is clear.  Cardiovascular:     Rate and Rhythm: Normal rate and regular rhythm.  Pulmonary:     Effort: Pulmonary effort is normal.     Breath sounds: Normal breath sounds.  Musculoskeletal:  Cervical back: Normal range of motion and neck supple.  Skin:    General: Skin is warm and dry.  Neurological:     Mental Status: She is alert and oriented to person, place, and time.  Psychiatric:        Mood and Affect: Mood normal.        Thought Content: Thought content normal.      Results for orders placed or performed in visit on 01/01/24  Veritor Flu A/B Waived  Result Value Ref Range   Influenza A Negative Negative   Influenza B Negative Negative      The ASCVD Risk score (Arnett DK, et al., 2019) failed to  calculate for the following reasons:   Cannot find a previous HDL lab   Cannot find a previous total cholesterol lab   * - Cholesterol units were assumed    Assessment & Plan:   Problem List Items Addressed This Visit       Digestive   Gastroesophageal reflux disease without esophagitis   Reflux symptoms persist despite low-dose omeprazole. - Increased omeprazole to 40 mg daily. - Advised continuation of current regimen if symptoms improve. - Increase to daily dosing if symptoms persist.      Relevant Medications   omeprazole (PRILOSEC) 40 MG capsule   Other Visit Diagnoses       Flu-like symptoms    -  Primary   Rapid flu test was negative.   - Advised continuation of OTC medications such as Mucinex. - Encouraged increased fluid intake.   Relevant Orders   Veritor Flu A/B Waived (Completed)     Upper respiratory tract infection, unspecified type       add oral antibiotic given duration of symptoms.  continue with OTC medications and increased fluids.   Relevant Medications   azithromycin (ZITHROMAX) 250 MG tablet       No follow-ups on file.    Amy Longs, FNP  "

## 2024-01-03 DIAGNOSIS — K219 Gastro-esophageal reflux disease without esophagitis: Secondary | ICD-10-CM | POA: Insufficient documentation

## 2024-01-03 LAB — VERITOR FLU A/B WAIVED
Influenza A: NEGATIVE
Influenza B: NEGATIVE

## 2024-01-03 NOTE — Assessment & Plan Note (Signed)
 Reflux symptoms persist despite low-dose omeprazole. - Increased omeprazole to 40 mg daily. - Advised continuation of current regimen if symptoms improve. - Increase to daily dosing if symptoms persist.

## 2024-01-04 ENCOUNTER — Ambulatory Visit: Payer: Self-pay

## 2024-01-04 ENCOUNTER — Ambulatory Visit (INDEPENDENT_AMBULATORY_CARE_PROVIDER_SITE_OTHER)

## 2024-01-04 VITALS — Ht 68.0 in | Wt 225.0 lb

## 2024-01-04 DIAGNOSIS — Z1211 Encounter for screening for malignant neoplasm of colon: Secondary | ICD-10-CM

## 2024-01-04 DIAGNOSIS — Z0000 Encounter for general adult medical examination without abnormal findings: Secondary | ICD-10-CM

## 2024-01-04 DIAGNOSIS — Z780 Asymptomatic menopausal state: Secondary | ICD-10-CM

## 2024-01-04 NOTE — Progress Notes (Signed)
 "  HM Addressed: DEXA scheduled Cologuard Ordered Chief Complaint  Patient presents with   Medicare Wellness     Subjective:   Amy Newman is a 74 y.o. female who presents for a Medicare Annual Wellness Visit.  Visit info / Clinical Intake: Medicare Wellness Visit Type:: Subsequent Annual Wellness Visit Persons participating in visit and providing information:: patient Medicare Wellness Visit Mode:: Telephone If telephone:: video error Since this visit was completed virtually, some vitals may be partially provided or unavailable. Missing vitals are due to the limitations of the virtual format.: Documented vitals are patient reported If Telephone or Video please confirm:: I connected with patient using audio/video enable telemedicine. I verified patient identity with two identifiers, discussed telehealth limitations, and patient agreed to proceed. Patient Location:: home Provider Location:: office Interpreter Needed?: No Pre-visit prep was completed: yes AWV questionnaire completed by patient prior to visit?: no Living arrangements:: (!) lives alone Patient's Overall Health Status Rating: very good Typical amount of pain: none Does pain affect daily life?: no Are you currently prescribed opioids?: no  Dietary Habits and Nutritional Risks How many meals a day?: 3 Eats fruit and vegetables daily?: yes Most meals are obtained by: preparing own meals In the last 2 weeks, have you had any of the following?: none Diabetic:: no  Functional Status Activities of Daily Living (to include ambulation/medication): Independent Ambulation: Independent Medication Administration: Independent Home Management (perform basic housework or laundry): Independent Manage your own finances?: yes Primary transportation is: driving Concerns about vision?: no *vision screening is required for WTM* Concerns about hearing?: no  Fall Screening Falls in the past year?: 1 Was there an injury  with Fall?: 0  Fall Risk Patient at Risk for Falls Due to: History of fall(s) Fall risk Follow up: Falls evaluation completed; Education provided; Falls prevention discussed  Home and Transportation Safety: All rugs have non-skid backing?: yes All stairs or steps have railings?: yes Grab bars in the bathtub or shower?: yes Have non-skid surface in bathtub or shower?: yes Good home lighting?: yes Regular seat belt use?: yes Hospital stays in the last year:: no  Cognitive Assessment Difficulty concentrating, remembering, or making decisions? : no Will 6CIT or Mini Cog be Completed: yes What year is it?: 0 points What month is it?: 0 points Give patient an address phrase to remember (5 components): [address] [place] what time is it?: 0 points Count backwards from 20 to 1: 0 points Say the months of the year in reverse: 0 points Repeat the address phrase from earlier: 0 points 6 CIT Score: 0 points  Advance Directives (For Healthcare) Does Patient Have a Medical Advance Directive?: No Would patient like information on creating a medical advance directive?: No - Patient declined  Reviewed/Updated  Reviewed/Updated: Reviewed All (Medical, Surgical, Family, Medications, Allergies, Care Teams, Patient Goals)    Allergies (verified) Hydrocodone, Tramadol, and Penicillins   Current Medications (verified) Outpatient Encounter Medications as of 01/04/2024  Medication Sig   amLODipine  (NORVASC ) 5 MG tablet TAKE 1 TABLET BY MOUTH DAILY. SEND REFILL REQUEST TO FAMILY DOCTOR   atorvastatin  (LIPITOR) 20 MG tablet Take 20 mg by mouth at bedtime.   azithromycin (ZITHROMAX) 250 MG tablet Take 2 tablets on day 1, then 1 tablet daily on days 2 through 5   brimonidine  (ALPHAGAN ) 0.2 % ophthalmic solution Place 1 drop into both eyes 2 (two) times daily.   cetirizine (ZYRTEC) 10 MG tablet Take 10 mg by mouth daily as needed for  allergies.   ibuprofen (ADVIL) 800 MG tablet Take 800 mg  by mouth as needed.   latanoprost  (XALATAN ) 0.005 % ophthalmic solution Place 1 drop into both eyes at bedtime.   meclizine (ANTIVERT) 25 MG tablet Take 25 mg by mouth every 6 (six) hours as needed for dizziness.   omeprazole (PRILOSEC) 40 MG capsule Take 1 capsule (40 mg total) by mouth daily.   No facility-administered encounter medications on file as of 01/04/2024.    History: Past Medical History:  Diagnosis Date   Arthritis    Cancer (HCC)    cervical cancer   Glaucoma    H/O: cesarean section    Hypertension    Lumbar spinal stenosis 09/29/2020   Obese    PONV (postoperative nausea and vomiting)    Past Surgical History:  Procedure Laterality Date   athroscopic knee surgery bilaterall Bilateral    BREAST BIOPSY  03/31/2022   Usual ductal hyperplasia MM RT RADIOACTIVE SEED LOC MAMMO GUIDE 03/31/2022 [facility]   BREAST EXCISIONAL BIOPSY Right 04/04/2022   Usual ductal hyperplasia   CARPAL TUNNEL RELEASE Right    CESAREAN SECTION     RADIOACTIVE SEED GUIDED EXCISIONAL BREAST BIOPSY Right 04/04/2022   Procedure: RIGHT BREAST SEED GUIDED EXCISIONAL BIOPSY;  Surgeon: Ebbie Cough, MD;  Location: [facility];  Service: General;  Laterality: Right;  LMA   ROTATOR CUFF REPAIR Right    TOTAL KNEE ARTHROPLASTY Left 08/23/2020   Procedure: TOTAL KNEE ARTHROPLASTY;  Surgeon: Melodi Lerner, MD;  Location: [facility];  Service: Orthopedics;  Laterality: Left;   TOTAL KNEE ARTHROPLASTY Right 12/20/2020   Procedure: TOTAL KNEE ARTHROPLASTY;  Surgeon: Melodi Lerner, MD;  Location: [facility];  Service: Orthopedics;  Laterality: Right;   Family History  Problem Relation Age of Onset   Stroke Mother    Diabetes Mother    Cancer Father    Breast cancer Sister    Cancer Sister        breast   Diabetes Brother    Peripheral Artery Disease Brother    Social History   Occupational History   Occupation: retired    Comment: Tax Inspector @ [employer]  Tobacco Use   Smoking  status: Never    Passive exposure: Never   Smokeless tobacco: Never  Vaping Use   Vaping status: Never Used  Substance and Sexual Activity   Alcohol use: Never   Drug use: Never   Sexual activity: Not Currently    Birth control/protection: Post-menopausal   Tobacco Counseling Counseling given: Yes  SDOH Screenings   Food Insecurity: No Food Insecurity (01/04/2024)  Housing: Low Risk (01/04/2024)  Transportation Needs: No Transportation Needs (01/04/2024)  Utilities: Not At Risk (01/04/2024)  Depression (PHQ2-9): Low Risk (01/04/2024)  Financial Resource Strain: Low Risk (09/05/2023)  Physical Activity: Sufficiently Active (01/04/2024)  Social Connections: Moderately Integrated (01/04/2024)  Stress: No Stress Concern Present (01/04/2024)  Tobacco Use: Low Risk (01/04/2024)  Health Literacy: Adequate Health Literacy (01/04/2024)   See flowsheets for full screening details  Depression Screen PHQ 2 & 9 Depression Scale- Over the past 2 weeks, how often have you been bothered by any of the following problems? Little interest or pleasure in doing things: 0 Feeling down, depressed, or hopeless (PHQ Adolescent also includes...irritable): 0 PHQ-2 Total Score: 0 Trouble falling or staying asleep, or sleeping too much: 0 Feeling tired or having little energy: 0 Poor appetite or overeating (PHQ Adolescent also includes...weight loss): 0 Feeling bad about yourself - or that you  are a failure or have let yourself or your family down: 0 Trouble concentrating on things, such as reading the newspaper or watching television (PHQ Adolescent also includes...like school work): 0 Moving or speaking so slowly that other people could have noticed. Or the opposite - being so fidgety or restless that you have been moving around a lot more than usual: 0 Thoughts that you would be better off dead, or of hurting yourself in some way: 0 PHQ-9 Total Score: 0 If you checked off any problems, how difficult have these problems  made it for you to do your work, take care of things at home, or get along with other people?: Not difficult at all     Goals Addressed               This Visit's Progress     Lose 15 lbs (pt-stated)               Objective:    Today's Vitals   01/04/24 1235  Weight: 225 lb (102.1 kg)  Height: 5' 8 (1.727 m)   Body mass index is 34.21 kg/m.  Hearing/Vision screen Hearing Screening - Comments:: Patient denies any hearing difficulties.   Vision Screening - Comments:: Patient is up to date on yearly eye exams with [facility] Immunizations and Health Maintenance Health Maintenance  Topic Date Due   Hepatitis C Screening  Never done   DTaP/Tdap/Td (1 - Tdap) Never done   Colonoscopy  Never done   Pneumococcal Vaccine: 50+ Years (1 of 1 - PCV) Never done   Zoster Vaccines- Shingrix (1 of 2) Never done   Bone Density Scan  02/25/2021   Medicare Annual Wellness (AWV)  05/18/2023   COVID-19 Vaccine (1 - 2025-26 season) Never done   Influenza Vaccine  04/01/2024 (Originally 08/03/2023)   Mammogram  10/24/2024   Meningococcal B Vaccine  Aged Out        Assessment/Plan:  This is a routine wellness examination for Trinia.  Patient Care Team: Bevely Doffing, FNP as PCP - General (Family Medicine) Blinda Katz, DPM as Referring Physician (Podiatry) Dr [facility], OD (Optometry) Mallipeddi, Diannah SQUIBB, MD as Consulting Physician (Cardiology)  I have personally reviewed and noted the following in the patients chart:   Medical and social history Use of alcohol, tobacco or illicit drugs  Current medications and supplements including opioid prescriptions. Functional ability and status Nutritional status Physical activity Advanced directives List of other physicians Hospitalizations, surgeries, and ER visits in previous 12 months Vitals Screenings to include cognitive, depression, and falls Referrals and appointments  Orders Placed  This Encounter  Procedures   DG Bone Density    Standing Status:   Future    Expiration Date:   01/03/2025    Reason for Exam (SYMPTOM  OR DIAGNOSIS REQUIRED):   post menopausal estrogen deficient    Preferred imaging location?:   [facility]   Cologuard   In addition, I have reviewed and discussed with patient certain preventive protocols, quality metrics, and best practice recommendations. A written personalized care plan for preventive services as well as general preventive health recommendations were provided to patient.   Jazlene Bares, CMA   01/04/2024   Return for [facility] Wellness Visit w/ Wellness Nurse on Tuesday January 06, 2025 at 1:10 pm.  After Visit Summary: (Mail) Due to this being a telephonic visit, the after visit summary with patients personalized plan was offered to patient via mail    "

## 2024-01-04 NOTE — Telephone Encounter (Signed)
 FYI Only or Action Required?: FYI only for provider: Pt refused UC, no OV available.  Patient was last seen in primary care on 01/01/2024 by Bevely Doffing, FNP.  Called Nurse Triage reporting Cough.  Symptoms began several days ago.  Interventions attempted: Prescription medications: azithromycin.  Symptoms are: unchanged.  Triage Disposition: See HCP Within 4 Hours (Or PCP Triage)  Patient/caregiver understands and will follow disposition?: No, refuses disposition Reason for Disposition  Wheezing is present  Answer Assessment - Initial Assessment Questions Pt reports cough x 2 days that began after her OV on 01/01/24. States she is experiencing some wheezing. Refused UC. Advised provider recommended mucinex. Patient will try that and call Monday or seek care over the weekend if no improvement.  Note: Pt was seen on 01/01/24 for flu-like symptoms and advised to take Mucinex, increase hydration and was prescribed azithromycin for dysuria.   1. ONSET: When did the cough begin?      2 days 2. SPUTUM: Describe the color of your sputum (e.g., none, dry cough; clear, white, yellow, green)     Non productive 5. DIFFICULTY BREATHING: Are you having difficulty breathing? If Yes, ask: How bad is it? (e.g., mild, moderate, severe)      Denies, but states has some wheezing 6. FEVER: Do you have a fever? If Yes, ask: What is your temperature, how was it measured, and when did it start?     Denies 7. CARDIAC HISTORY: Do you have any history of heart disease? (e.g., heart attack, congestive heart failure)      HTN 8. LUNG HISTORY: Do you have any history of lung disease?  (e.g., pulmonary embolus, asthma, emphysema)     None listed  Protocols used: Cough - Acute Non-Productive-A-AH  Copied from CRM #[id number]. Topic: Clinical - Medication Question >> Jan 04, 2024 12:41 PM Travis F wrote: Reason for CRM: Patient is calling in requesting for her provider to send in some  medication for a cough. Patient says she has had the cough since Christmas day and she was given a zpak but she needs something for the cough.

## 2024-01-04 NOTE — Patient Instructions (Addendum)
 Ms. Ehrich,  Thank you for taking the time for your Medicare Wellness Visit. I appreciate your continued commitment to your health goals. Please review the care plan we discussed, and feel free to reach out if I can assist you further.  Please note that Annual Wellness Visits do not include a physical exam. Some assessments may be limited, especially if the visit was conducted virtually. If needed, we may recommend an in-person follow-up with your provider.  Ongoing Care Seeing your primary care provider every 3 to 6 months helps us  monitor your health and provide consistent, personalized care.   1 year follow up for Medicare well visit: Tuesday January 06, 2025 at 1:10pm with medicare wellness nurse in office  BONE DENSITY APPOINTMENT: January 23, 2024 AT 11:00 AM AT [facility]  Referrals If a referral was made during today's visit and you haven't received any updates within two weeks, please contact the referred provider directly to check on the status.  Osteoporosis Screening An order was placed for you to have your Osteoporosis Screening. Call the number below to schedule that [facility]  [phone]   Cologuard (at home testing) Cologuard was ordered today.If you haven't received your kit in the next 2 weeks, please call: [employer] [phone].   Recommended Screenings:  Health Maintenance  Topic Date Due   Hepatitis C Screening  Never done   DTaP/Tdap/Td vaccine (1 - Tdap) Never done   Colon Cancer Screening  Never done   Pneumococcal Vaccine for age over 24 (1 of 1 - PCV) Never done   Zoster (Shingles) Vaccine (1 of 2) Never done   Osteoporosis screening with Bone Density Scan  02/25/2021   Medicare Annual Wellness Visit  05/18/2023   COVID-19 Vaccine (1 - 2025-26 season) Never done   Flu Shot  04/01/2024*   Breast Cancer Screening  10/24/2024   Meningitis B Vaccine  Aged Out  *Topic was postponed. The date shown is not the original due date.        01/04/2024   12:36 PM  Advanced Directives  Does Patient Have a Medical Advance Directive? No  Would patient like information on creating a medical advance directive? No - Patient declined    Vision: Annual vision screenings are recommended for early detection of glaucoma, cataracts, and diabetic retinopathy. These exams can also reveal signs of chronic conditions such as diabetes and high blood pressure.  Dental: Annual dental screenings help detect early signs of oral cancer, gum disease, and other conditions linked to overall health, including heart disease and diabetes.  Please see the attached documents for additional preventive care recommendations.  SABRA

## 2024-01-04 NOTE — Telephone Encounter (Signed)
 Noted

## 2024-01-23 ENCOUNTER — Ambulatory Visit (HOSPITAL_COMMUNITY)
Admission: RE | Admit: 2024-01-23 | Discharge: 2024-01-23 | Disposition: A | Discharge status: Home / Self Care | Source: Ambulatory Visit

## 2024-01-23 DIAGNOSIS — Z780 Asymptomatic menopausal state: Secondary | ICD-10-CM | POA: Diagnosis present

## 2024-01-24 LAB — COLOGUARD

## 2024-02-06 NOTE — Progress Notes (Signed)
 Amy Newman                                          MRN: [id number]   02/06/2024   The [facility] Quality Team Specialist reviewed this patient medical record for the purposes of chart review for care gap closure. The following were reviewed: chart review for care gap closure-controlling blood pressure.    [facility] Quality Team

## 2024-03-04 ENCOUNTER — Ambulatory Visit

## 2025-01-06 ENCOUNTER — Ambulatory Visit: Payer: Self-pay
